# Patient Record
Sex: Female | Born: 1980
Health system: Southern US, Community
[De-identification: ages and names within clinical notes are randomized; demographics above are authoritative.]

## PROBLEM LIST (undated history)

## (undated) DIAGNOSIS — N289 Disorder of kidney and ureter, unspecified: Secondary | ICD-10-CM

## (undated) DIAGNOSIS — I10 Essential (primary) hypertension: Secondary | ICD-10-CM

---

## 2017-03-26 ENCOUNTER — Encounter (HOSPITAL_BASED_OUTPATIENT_CLINIC_OR_DEPARTMENT_OTHER): Payer: Self-pay | Admitting: Emergency Medicine

## 2017-03-26 ENCOUNTER — Emergency Department (HOSPITAL_BASED_OUTPATIENT_CLINIC_OR_DEPARTMENT_OTHER)
Admission: EM | Admit: 2017-03-26 | Discharge: 2017-03-26 | Disposition: A | Discharge status: Home / Self Care | Payer: Self-pay | Attending: Physician Assistant | Admitting: Physician Assistant

## 2017-03-26 ENCOUNTER — Emergency Department (HOSPITAL_BASED_OUTPATIENT_CLINIC_OR_DEPARTMENT_OTHER): Payer: Self-pay

## 2017-03-26 DIAGNOSIS — I10 Essential (primary) hypertension: Secondary | ICD-10-CM | POA: Insufficient documentation

## 2017-03-26 DIAGNOSIS — R51 Headache: Secondary | ICD-10-CM | POA: Insufficient documentation

## 2017-03-26 DIAGNOSIS — Z79899 Other long term (current) drug therapy: Secondary | ICD-10-CM | POA: Insufficient documentation

## 2017-03-26 HISTORY — DX: Essential (primary) hypertension: I10

## 2017-03-26 HISTORY — DX: Disorder of kidney and ureter, unspecified: N28.9

## 2017-03-26 LAB — URINALYSIS, ROUTINE W REFLEX MICROSCOPIC
Bilirubin Urine: NEGATIVE
Glucose, UA: NEGATIVE mg/dL
Ketones, ur: NEGATIVE mg/dL
LEUKOCYTES UA: NEGATIVE
Nitrite: NEGATIVE
PROTEIN: 100 mg/dL — AB
SPECIFIC GRAVITY, URINE: 1.007 (ref 1.005–1.030)
pH: 5 (ref 5.0–8.0)

## 2017-03-26 LAB — URINALYSIS, MICROSCOPIC (REFLEX)

## 2017-03-26 LAB — PREGNANCY, URINE: PREG TEST UR: NEGATIVE

## 2017-03-26 MED ORDER — AMLODIPINE BESYLATE 10 MG PO TABS: 10.0000 mg | ORAL_TABLET | Freq: Every day | ORAL | 1 refills | Status: DC

## 2017-03-26 MED ORDER — LOSARTAN POTASSIUM 50 MG PO TABS: 50.0000 mg | ORAL_TABLET | Freq: Every day | ORAL | 1 refills | Status: DC

## 2017-03-26 MED ORDER — IBUPROFEN 800 MG PO TABS
800.0000 mg | ORAL_TABLET | Freq: Once | ORAL | Status: AC
  Administered 2017-03-26: 800 mg via ORAL
  Filled 2017-03-26: qty 1

## 2017-03-26 MED ORDER — ACETAMINOPHEN 325 MG PO TABS
650.0000 mg | ORAL_TABLET | Freq: Once | ORAL | Status: AC
  Administered 2017-03-26: 650 mg via ORAL
  Filled 2017-03-26: qty 2

## 2017-03-26 MED ORDER — SUMATRIPTAN SUCCINATE 25 MG PO TABS
25.0000 mg | ORAL_TABLET | Freq: Once | ORAL | Status: DC
  Filled 2017-03-26: qty 1

## 2017-03-26 NOTE — Discharge Instructions (Signed)
To find a primary care or specialty doctor please call [phone] or [phone] to access "[facility] Find a Doctor Service." ° °You may also go on the [facility] website at [email or web address] ° °There are also multiple [facility] practices throughout the [place] that are frequently accepting new patients. You may find a clinic that is close to your home and contact them. ° °[facility] -  °[address] °[place] [address] °[phone] ° °[facility] in [place] (also locations in [place] and [place]) -  °[address] °[place] [address] [phone] ° °[facility] -  °[address] °[place] [address] [phone] ° ° °

## 2017-03-26 NOTE — ED Provider Notes (Addendum)
[facility] DEPT [facility] Provider Note   CSN: [id number] Arrival date & time: 03/26/17  1218     History   Chief Complaint Chief Complaint  Patient presents with  . Headache    HPI Samantha Barr is a 36 y.o. female.  HPI  Patient is a 36 year old female presenting with headaches. Patient has had headaches in the past.  She's not taking anything for her headhace today. She woke up with a headache this morning. She is concerned because she felt like her blood pressure was high. Patient is not having neurologic symptoms. Patient is accompanied by cousin his use as a Optometrist.  No nausea or vomiting or photophobia  Past Medical History:  Diagnosis Date  . Hypertension   . Renal disorder     There are no active problems to display for this patient.   History reviewed. No pertinent surgical history.  OB History    No data available       Home Medications    Prior to Admission medications   Medication Sig Start Date End Date Taking? Authorizing Provider  amLODipine (NORVASC) 10 MG tablet Take 10 mg by mouth daily.   Yes [provider]  losartan (COZAAR) 50 MG tablet Take 50 mg by mouth daily.   Yes [provider]    Family History No family history on file.  Social History Social History  Substance Use Topics  . Smoking status: Never Smoker  . Smokeless tobacco: Never Used  . Alcohol use No     Allergies   Patient has no known allergies.   Review of Systems Review of Systems  Constitutional: Negative for activity change.  Respiratory: Negative for shortness of breath.   Cardiovascular: Negative for chest pain.  Gastrointestinal: Negative for abdominal pain.  Neurological: Positive for headaches.     Physical Exam Updated Vital Signs BP (!) 124/95 (BP Location: Left Arm)   Pulse 96   Temp 99.2 F (37.3 C) (Oral)   Resp 16   Wt 70 kg (154 lb 5.2 oz)   LMP 02/11/2017 Comment: 4 months postpartum  SpO2 98%   Physical  Exam  Constitutional: She is oriented to person, place, and time. She appears well-developed and well-nourished.  HENT:  Head: Normocephalic and atraumatic.  Eyes: Conjunctivae and EOM are normal. Pupils are equal, round, and reactive to light. Right eye exhibits no discharge. Left eye exhibits no discharge.  Cardiovascular: Normal rate and regular rhythm.   No murmur heard. Pulmonary/Chest: Effort normal and breath sounds normal. No respiratory distress.  Neurological: She is oriented to person, place, and time.  Equal strength bilaterally upper and lower extremities negative pronator drift. Normal sensation bilaterally. Speech comprehensible, no slurring. Facial nerve tested and appears grossly normal. Alert and oriented 3.   Skin: Skin is warm and dry. She is not diaphoretic.  Psychiatric: She has a normal mood and affect.  Nursing note and vitals reviewed.    ED Treatments / Results  Labs (all labs ordered are listed, but only abnormal results are displayed) Labs Reviewed  URINALYSIS, ROUTINE W REFLEX MICROSCOPIC - Abnormal; Notable for the following:       Result Value   Hgb urine dipstick TRACE (*)    Protein, ur 100 (*)    All other components within normal limits  URINALYSIS, MICROSCOPIC (REFLEX) - Abnormal; Notable for the following:    Bacteria, UA RARE (*)    Squamous Epithelial / LPF 0-5 (*)    All other components within  normal limits  PREGNANCY, URINE    EKG  EKG Interpretation None       Radiology Ct Head Wo Contrast  Result Date: 03/26/2017 CLINICAL DATA:  36 year old female with a history of headache EXAM: CT HEAD WITHOUT CONTRAST TECHNIQUE: Contiguous axial images were obtained from the base of the skull through the vertex without intravenous contrast. COMPARISON:  None. FINDINGS: Brain: No acute intracranial hemorrhage. No midline shift or mass effect. Gray-white differentiation maintained. Unremarkable appearance of the ventricular system. Vascular:  Unremarkable. Skull: No acute fracture.  No aggressive bone lesion identified. Sinuses/Orbits: Unremarkable appearance of the orbits. Mastoid air cells clear. No middle ear effusion. No significant sinus disease. Other: None IMPRESSION: No CT evidence of acute intracranial abnormality Electronically Signed   By: Corrie Mckusick D.O.   On: 03/26/2017 13:31    Procedures Procedures (including critical care time)  Medications Ordered in ED Medications  SUMAtriptan (IMITREX) tablet 25 mg (not administered)  ibuprofen (ADVIL,MOTRIN) tablet 800 mg (not administered)     Initial Impression / Assessment and Plan / ED Course  I have reviewed the triage vital signs and the nursing notes.  Pertinent labs & imaging results that were available during my care of the patient were reviewed by me and considered in my medical decision making (see chart for details).     Patient is a well-appearing 36 year old female presenting with headache. Is difficult to tellwhat made her come today. Patient's cousin was worried because of a elevated blood pressure taken at home. Patient's blood pressure here is reassuring. She has a normal neurologic exam. We'll get a CT head to make sure that we are not missing an intracranial issue. Otherwise we'll we gave her the option of treating orally or the IV. She is fine with oral treatment at this time.  2:10 PM Figured out why patient came in today. She is actually looking for refills on her prescriptions for high blood pressure that she was on prior to moving to the [place] We will refill both of those Prescriptions. We'll also give her our [facility] phone number to establish care with someone who can treat her without insurance.  Patient breasting a 4 mo.   Final Clinical Impressions(s) / ED Diagnoses   Final diagnoses:  None    New Prescriptions New Prescriptions   No medications on file     Macarthur Critchley, MD 03/26/17 1410    Macarthur Critchley, MD 03/26/17 1411

## 2017-03-26 NOTE — ED Triage Notes (Signed)
H/A today and nausea.

## 2017-09-19 ENCOUNTER — Emergency Department (HOSPITAL_COMMUNITY)
Admission: EM | Admit: 2017-09-19 | Discharge: 2017-09-19 | Disposition: A | Discharge status: Home / Self Care | Payer: Self-pay | Attending: Emergency Medicine | Admitting: Emergency Medicine

## 2017-09-19 ENCOUNTER — Encounter (HOSPITAL_COMMUNITY): Payer: Self-pay | Admitting: *Deleted

## 2017-09-19 ENCOUNTER — Other Ambulatory Visit: Payer: Self-pay

## 2017-09-19 DIAGNOSIS — I10 Essential (primary) hypertension: Secondary | ICD-10-CM

## 2017-09-19 DIAGNOSIS — R7989 Other specified abnormal findings of blood chemistry: Secondary | ICD-10-CM | POA: Insufficient documentation

## 2017-09-19 DIAGNOSIS — R590 Localized enlarged lymph nodes: Secondary | ICD-10-CM | POA: Insufficient documentation

## 2017-09-19 DIAGNOSIS — Z79899 Other long term (current) drug therapy: Secondary | ICD-10-CM | POA: Insufficient documentation

## 2017-09-19 DIAGNOSIS — K0889 Other specified disorders of teeth and supporting structures: Secondary | ICD-10-CM | POA: Insufficient documentation

## 2017-09-19 DIAGNOSIS — I129 Hypertensive chronic kidney disease with stage 1 through stage 4 chronic kidney disease, or unspecified chronic kidney disease: Secondary | ICD-10-CM | POA: Insufficient documentation

## 2017-09-19 DIAGNOSIS — R109 Unspecified abdominal pain: Secondary | ICD-10-CM | POA: Insufficient documentation

## 2017-09-19 DIAGNOSIS — N189 Chronic kidney disease, unspecified: Secondary | ICD-10-CM | POA: Insufficient documentation

## 2017-09-19 LAB — BASIC METABOLIC PANEL
Anion gap: 5 (ref 5–15)
BUN: 20 mg/dL (ref 6–20)
CALCIUM: 9.2 mg/dL (ref 8.9–10.3)
CHLORIDE: 108 mmol/L (ref 101–111)
CO2: 26 mmol/L (ref 22–32)
CREATININE: 1.6 mg/dL — AB (ref 0.44–1.00)
GFR, EST AFRICAN AMERICAN: 46 mL/min — AB (ref >60)
GFR, EST NON AFRICAN AMERICAN: 40 mL/min — AB (ref >60)
Glucose, Bld: 109 mg/dL — ABNORMAL HIGH (ref 65–99)
Potassium: 4.9 mmol/L (ref 3.5–5.1)
SODIUM: 139 mmol/L (ref 135–145)

## 2017-09-19 LAB — URINALYSIS, ROUTINE W REFLEX MICROSCOPIC
BILIRUBIN URINE: NEGATIVE
Glucose, UA: NEGATIVE mg/dL
Ketones, ur: NEGATIVE mg/dL
LEUKOCYTES UA: NEGATIVE
Nitrite: NEGATIVE
Protein, ur: 100 mg/dL — AB
SPECIFIC GRAVITY, URINE: 1.006 (ref 1.005–1.030)
Squamous Epithelial / LPF: NONE SEEN
pH: 5 (ref 5.0–8.0)

## 2017-09-19 LAB — PREGNANCY, URINE: Preg Test, Ur: NEGATIVE

## 2017-09-19 MED ORDER — CLINDAMYCIN HCL 300 MG PO CAPS: 300.0000 mg | ORAL_CAPSULE | Freq: Three times a day (TID) | ORAL | 0 refills | Status: DC

## 2017-09-19 NOTE — ED Triage Notes (Signed)
Pt has a history of htn and takes losarten and amlodipine. The hbp has affected. Pt states left flank area has been hurting for 2 weeks and complains of knots under her ears.

## 2017-09-19 NOTE — ED Provider Notes (Signed)
[facility] EMERGENCY DEPARTMENT Provider Note   CSN: [id number] Arrival date & time: 09/19/17  1107     History   Chief Complaint No chief complaint on file.   HPI Samantha Barr is a 36 y.o. female who presents with L flank pain and "knots" around the ears. PMH significant for HTN and CKD. The patient's husband is at bedside and helps provide history. History is limited due to language barrier. She states that she had hypertension and takes Amlodipine and Losartan. She was in [place] several months ago and went for a blood pressure check and the doctor told her she had a problem with her kidneys. She has been back in the [place] for over a month now and reports that she has developed left sided flank pain for the past week. It is intermittent. Nothing makes it better or worse. She has not tried anything for pain. She denies fever, chills, chest pain, SOB, abdominal pain, N/V/D, urinary symptoms. She has not had a period in several years since she had her last baby.  Additionally she is complaining of "knots" behind her ears. These have been present for about 1 month. They are on both sides of her head and mildly tender. She denies headache, ear pain, runny nose, sore throat. She does have left lower dental pain. She last saw a dentist a couple weeks ago who performed a cleaning.   She does not currently have a PCP.  HPI  Past Medical History:  Diagnosis Date  . Hypertension   . Renal disorder     There are no active problems to display for this patient.   History reviewed. No pertinent surgical history.  OB History    No data available       Home Medications    Prior to Admission medications   Medication Sig Start Date End Date Taking? Authorizing Provider  amLODipine (NORVASC) 10 MG tablet Take 10 mg by mouth daily.    [provider]  amLODipine (NORVASC) 10 MG tablet Take 1 tablet (10 mg total) by mouth daily. 03/26/17   Mackuen, Courteney Lyn,  MD  losartan (COZAAR) 50 MG tablet Take 50 mg by mouth daily.    [provider]  losartan (COZAAR) 50 MG tablet Take 1 tablet (50 mg total) by mouth daily. 03/26/17   Mackuen, Fredia Sorrow, MD    Family History No family history on file.  Social History Social History   Tobacco Use  . Smoking status: Never Smoker  . Smokeless tobacco: Never Used  Substance Use Topics  . Alcohol use: No  . Drug use: No     Allergies   Patient has no known allergies.   Review of Systems Review of Systems  Constitutional: Negative for chills and fever.  Respiratory: Negative for shortness of breath.   Cardiovascular: Negative for chest pain.  Gastrointestinal: Negative for abdominal pain, diarrhea, nausea and vomiting.  Genitourinary: Positive for flank pain. Negative for difficulty urinating, dysuria, hematuria, menstrual problem, vaginal bleeding and vaginal discharge.  Allergic/Immunologic: Negative for immunocompromised state.  Hematological: Positive for adenopathy.  All other systems reviewed and are negative.    Physical Exam Updated Vital Signs BP 119/86   Pulse 90   Temp 98.5 F (36.9 C) (Oral)   Resp 16   SpO2 100%   Physical Exam  Constitutional: She is oriented to person, place, and time. She appears well-developed and well-nourished. No distress.  HENT:  Head: Normocephalic and atraumatic.  Right Ear:  Hearing, tympanic membrane, external ear and ear canal normal.  Left Ear: Hearing, tympanic membrane, external ear and ear canal normal.  Nose: Nose normal.  Mouth/Throat: Uvula is midline, oropharynx is clear and moist and mucous membranes are normal. No dental abscesses.  Posterior occipital bilateral lymphadenopathy  Eyes: Conjunctivae are normal. Pupils are equal, round, and reactive to light. Right eye exhibits no discharge. Left eye exhibits no discharge. No scleral icterus.  Neck: Normal range of motion.  Cardiovascular: Normal rate and regular rhythm.  Exam reveals no gallop and no friction rub.  No murmur heard. Pulmonary/Chest: Effort normal and breath sounds normal. No stridor. No respiratory distress. She has no wheezes. She has no rales. She exhibits no tenderness.  Abdominal: Soft. Bowel sounds are normal. She exhibits no distension and no mass. There is no tenderness. There is no rebound and no guarding. No hernia.  No CVA tenderness  Neurological: She is alert and oriented to person, place, and time.  Skin: Skin is warm and dry.  Psychiatric: She has a normal mood and affect. Her behavior is normal.  Nursing note and vitals reviewed.    ED Treatments / Results  Labs (all labs ordered are listed, but only abnormal results are displayed) Labs Reviewed  URINALYSIS, ROUTINE W REFLEX MICROSCOPIC - Abnormal; Notable for the following components:      Result Value   Color, Urine STRAW (*)    Hgb urine dipstick SMALL (*)    Protein, ur 100 (*)    Bacteria, UA RARE (*)    All other components within normal limits  BASIC METABOLIC PANEL - Abnormal; Notable for the following components:   Glucose, Bld 109 (*)    Creatinine, Ser 1.60 (*)    GFR calc non Af Amer 40 (*)    GFR calc Af Amer 46 (*)    All other components within normal limits  PREGNANCY, URINE  POC URINE PREG, ED    EKG  EKG Interpretation None       Radiology No results found.  Procedures Procedures (including critical care time)  Medications Ordered in ED Medications - No data to display   Initial Impression / Assessment and Plan / ED Course  I have reviewed the triage vital signs and the nursing notes.  Pertinent labs & imaging results that were available during my care of the patient were reviewed by me and considered in my medical decision making (see chart for details).  36 year old female with L flank pain and lymphadenopathy. Vitals are normal. BMP shows SCr is 1.6 with no prior value to compare. UA shows small hgb, 100 protein, rare bacteria.  She likely has CKD. Blood pressure in the ED is controlled. Unclear etiology of flank pain, may be muscular but she has no tenderness on exam. I do not see the need for any emergent testing at this time. I advised her to follow up with [facility].   She does have bilateral posterior occipital lymphadenopathy. Unclear etiology of this as well. She denies fevers and HEENT exam is otherwise unremarkable. She is complaining of some dental pain. Will give rx for Clindamycin and again advised her to follow up with PCP to ensure this is resolving.    Final Clinical Impressions(s) / ED Diagnoses   Final diagnoses:  Hypertension, unspecified type  Elevated serum creatinine  Lymphadenopathy of head and neck region    ED Discharge Orders    None       Gekas,  Jesse Fall, PA-C 09/19/17 2011    Fatima Blank, MD 09/20/17 [phone]

## 2017-09-19 NOTE — Discharge Instructions (Signed)
Please continue blood pressure medicines Take Tylenol for pain. Avoid NSAIDS (Ibuprofen, Naproxen) Take Clindamycin three times daily for 10 days Follow up with [facility] for blood pressure recheck and to check lymph node

## 2017-10-13 ENCOUNTER — Ambulatory Visit: Payer: Self-pay | Attending: Nurse Practitioner | Admitting: Nurse Practitioner

## 2017-10-13 ENCOUNTER — Encounter: Payer: Self-pay | Admitting: Nurse Practitioner

## 2017-10-13 ENCOUNTER — Inpatient Hospital Stay: Payer: Self-pay | Admitting: Family Medicine

## 2017-10-13 VITALS — BP 108/75 | HR 87 | Temp 98.4°F | Ht 65.35 in | Wt 152.4 lb

## 2017-10-13 DIAGNOSIS — Z79899 Other long term (current) drug therapy: Secondary | ICD-10-CM | POA: Insufficient documentation

## 2017-10-13 DIAGNOSIS — N183 Chronic kidney disease, stage 3 unspecified: Secondary | ICD-10-CM

## 2017-10-13 DIAGNOSIS — I10 Essential (primary) hypertension: Secondary | ICD-10-CM

## 2017-10-13 DIAGNOSIS — I129 Hypertensive chronic kidney disease with stage 1 through stage 4 chronic kidney disease, or unspecified chronic kidney disease: Secondary | ICD-10-CM | POA: Insufficient documentation

## 2017-10-13 LAB — POCT GLYCOSYLATED HEMOGLOBIN (HGB A1C): HEMOGLOBIN A1C: 5.1

## 2017-10-13 MED ORDER — LOSARTAN POTASSIUM 50 MG PO TABS: 50.0000 mg | ORAL_TABLET | Freq: Every day | ORAL | 1 refills | Status: DC

## 2017-10-13 MED ORDER — AMLODIPINE BESYLATE 10 MG PO TABS: 10.0000 mg | ORAL_TABLET | Freq: Every day | ORAL | 1 refills | Status: DC

## 2017-10-13 NOTE — Progress Notes (Signed)
Assessment & Plan:  Samantha Barr was seen today for hospitalization follow-up.  Diagnoses and all orders for this visit:  Essential hypertension -     HgB A1c -     CBC -     Lipid panel Continue all antihypertensives as prescribed.  Remember to bring in your blood pressure log with you for your follow up appointment.  DASH/Mediterranean Diets are healthier choices for HTN.       amLODipine (NORVASC) 10 MG tablet; Take 1 tablet (10 mg total) by mouth daily. -     losartan (COZAAR) 50 MG tablet; Take 1 tablet (50 mg total) by mouth daily.  Stage 3 chronic kidney disease ([place]) -     Ambulatory referral to Nephrology Do not use more than 1.8gms of Na in your diet daily. Drink at least 48 oz of water per day.   Patient has been counseled on age-appropriate routine health concerns for screening and prevention. These are reviewed and up-to-date. Referrals have been placed accordingly. Immunizations are up-to-date or declined.    Subjective:   Chief Complaint  Patient presents with  . Hospitalization Follow-up    Patient is here for a hospital follow-up for hypertension.    HPI Samantha Barr 36 y.o. female presents to office today to establish care as a new patient. Her husband is here interpreting for her today.   Essential Hypertension She reports having a history of hypertension. She was diagnosed over 5 years ago. She has been taking losartan 50mg  and norvasc 10mg  daily as prescribed. She does report missing doses "sometimes". She has paperwork from [place] dated 11-23-2016 that notes CKD with 8cm kidney and left kidney 7.8cm with renal parenchymal thickness of 20mm on left and 13mm on right. She has not seen a nephrologist. She denies any GU symptoms at this time. Blood pressure is well controlled. She denies chest pain, shortness of breath, palpitations, lightheadedness, dizziness, hematuria, flank pain, bilateral lower extremity edema, anuria, oliguria or polyuria.  BP Readings from  Last 3 Encounters:  10/13/17 108/75  09/19/17 114/90  03/26/17 (!) 124/95    Lab Results  Component Value Date   CREATININE 1.60 (H) 09/19/2017     Review of Systems  Constitutional: Negative for fever, malaise/fatigue and weight loss.  HENT: Negative.  Negative for nosebleeds.   Eyes: Negative.  Negative for blurred vision, double vision and photophobia.  Respiratory: Negative.  Negative for cough and shortness of breath.   Cardiovascular: Negative.  Negative for chest pain, palpitations and leg swelling.  Gastrointestinal: Negative.  Negative for abdominal pain, constipation, diarrhea, heartburn, nausea and vomiting.  Musculoskeletal: Negative.  Negative for myalgias.  Neurological: Negative.  Negative for dizziness, focal weakness, seizures and headaches.  Endo/Heme/Allergies: Negative for environmental allergies.  Psychiatric/Behavioral: Negative.  Negative for suicidal ideas.    Past Medical History:  Diagnosis Date  . Hypertension   . Renal disorder     History reviewed. No pertinent surgical history.  Family History  Problem Relation Age of Onset  . Hypertension Maternal Aunt     Social History Reviewed with no changes to be made today.   Outpatient Medications Prior to Visit  Medication Sig Dispense Refill  . amLODipine (NORVASC) 10 MG tablet Take 10 mg by mouth daily.    Marland Kitchen amLODipine (NORVASC) 10 MG tablet Take 1 tablet (10 mg total) by mouth daily. 30 tablet 1  . losartan (COZAAR) 50 MG tablet Take 50 mg by mouth daily.    Marland Kitchen losartan (COZAAR) 50  MG tablet Take 1 tablet (50 mg total) by mouth daily. 30 tablet 1  . clindamycin (CLEOCIN) 300 MG capsule Take 1 capsule (300 mg total) by mouth 3 (three) times daily. (Patient not taking: Reported on 10/13/2017) 30 capsule 0   No facility-administered medications prior to visit.     No Known Allergies     Objective:    BP 108/75 (BP Location: Left Arm, Patient Position: Sitting, Cuff Size: Normal)   Pulse 87    Temp 98.4 F (36.9 C) (Oral)   Ht 5' 5.35" (1.66 m)   Wt 152 lb 6.4 oz (69.1 kg)   SpO2 97%   BMI 25.09 kg/m  Wt Readings from Last 3 Encounters:  10/13/17 152 lb 6.4 oz (69.1 kg)  03/26/17 154 lb 5.2 oz (70 kg)    Physical Exam  Constitutional: She is oriented to person, place, and time. She appears well-developed and well-nourished. She is cooperative.  HENT:  Head: Normocephalic and atraumatic.  Eyes: EOM are normal.  Neck: Normal range of motion.  Cardiovascular: Normal rate, regular rhythm, normal heart sounds and intact distal pulses. Exam reveals no gallop and no friction rub.  No murmur heard. Pulmonary/Chest: Effort normal and breath sounds normal. No tachypnea. No respiratory distress. She has no decreased breath sounds. She has no wheezes. She has no rhonchi. She has no rales. She exhibits no tenderness.  Abdominal: Soft. Bowel sounds are normal.  Musculoskeletal: Normal range of motion. She exhibits no edema.  Neurological: She is alert and oriented to person, place, and time. Coordination normal.  Skin: Skin is warm and dry.  Psychiatric: She has a normal mood and affect. Her behavior is normal. Judgment and thought content normal.  Nursing note and vitals reviewed.      Patient has been counseled extensively about nutrition and exercise as well as the importance of adherence with medications and regular follow-up. The patient was given clear instructions to go to ER or return to medical center if symptoms don't improve, worsen or new problems develop. The patient verbalized understanding.   Follow-up: Return in about 2 months (around 12/14/2017) for NEEDS TO SCHEDULE PAP AND PHYSICAL: Needs appointment with financial representative.Gildardo Pounds, FNP-BC [facility] [place], [place]   10/18/2017, 10:54 PM

## 2017-10-13 NOTE — Patient Instructions (Addendum)
You can use Mrs. Dash instead of actual salt to season instead of salt.     Chronic Kidney Disease, Adult Chronic kidney disease (CKD) happens when the kidneys are damaged during a time of 3 or more months. The kidneys are two organs that do many important jobs in the body. These jobs include:  Removing wastes and extra fluids from the blood.  Making hormones that maintain the amount of fluid in your tissues and blood vessels.  Making sure that the body has the right amount of fluids and chemicals.  Most of the time, this condition does not go away, but it can usually be controlled. Steps must be taken to slow down the kidney damage or stop it from getting worse. Otherwise, the kidneys may stop working. Follow these instructions at home:  Follow your diet as told by your doctor. You may need to avoid alcohol, salty foods (sodium), and foods that are high in potassium, calcium, and protein.  Take over-the-counter and prescription medicines only as told by your doctor. Do not take any new medicines unless your doctor says you can do that. These include vitamins and minerals. ? Medicines and nutritional supplements can make kidney damage worse. ? Your doctor may need to change how much medicine you take.  Do not use any tobacco products. These include cigarettes, chewing tobacco, and e-cigarettes. If you need help quitting, ask your doctor.  Keep all follow-up visits as told by your doctor. This is important.  Check your blood pressure. Tell your doctor if there are changes to your blood pressure.  Get to a healthy weight. Stay at that weight. If you need help with this, ask your doctor.  Start or continue an exercise plan. Try to exercise at least 30 minutes a day, 5 days a week.  Stay up-to-date with your shots (immunizations) as told by your doctor. Contact a doctor if:  Your symptoms get worse.  You have new symptoms. Get help right away if:  You have symptoms of end-stage  kidney disease. These include: ? Headaches. ? Skin that is darker or lighter than normal. ? Numbness in your hands or feet. ? Easy bruising. ? Having hiccups often. ? Chest pain. ? Shortness of breath. ? Stopping of menstrual periods in women.  You have a fever.  You are making very little pee (urine).  You have pain or bleeding when you pee (urinate). This information is not intended to replace advice given to you by your health care provider. Make sure you discuss any questions you have with your health care provider. Document Released: 01/11/2010 Document Revised: 03/24/2016 Document Reviewed: 06/15/2012 Elsevier Interactive Patient Education  2017 [employer]  Chronic Kidney Disease, Adult Chronic kidney disease (CKD) happens when the kidneys are damaged during a time of 3 or more months. The kidneys are two organs that do many important jobs in the body. These jobs include:  Removing wastes and extra fluids from the blood.  Making hormones that maintain the amount of fluid in your tissues and blood vessels.  Making sure that the body has the right amount of fluids and chemicals.  Most of the time, this condition does not go away, but it can usually be controlled. Steps must be taken to slow down the kidney damage or stop it from getting worse. Otherwise, the kidneys may stop working. Follow these instructions at home:  Follow your diet as told by your doctor. You may need to avoid alcohol, salty foods (sodium), and foods  that are high in potassium, calcium, and protein.  Take over-the-counter and prescription medicines only as told by your doctor. Do not take any new medicines unless your doctor says you can do that. These include vitamins and minerals. ? Medicines and nutritional supplements can make kidney damage worse. ? Your doctor may need to change how much medicine you take.  Do not use any tobacco products. These include cigarettes, chewing tobacco, and  e-cigarettes. If you need help quitting, ask your doctor.  Keep all follow-up visits as told by your doctor. This is important.  Check your blood pressure. Tell your doctor if there are changes to your blood pressure.  Get to a healthy weight. Stay at that weight. If you need help with this, ask your doctor.  Start or continue an exercise plan. Try to exercise at least 30 minutes a day, 5 days a week.  Stay up-to-date with your shots (immunizations) as told by your doctor. Contact a doctor if:  Your symptoms get worse.  You have new symptoms. Get help right away if:  You have symptoms of end-stage kidney disease. These include: ? Headaches. ? Skin that is darker or lighter than normal. ? Numbness in your hands or feet. ? Easy bruising. ? Having hiccups often. ? Chest pain. ? Shortness of breath. ? Stopping of menstrual periods in women.  You have a fever.  You are making very little pee (urine).  You have pain or bleeding when you pee (urinate). This information is not intended to replace advice given to you by your health care provider. Make sure you discuss any questions you have with your health care provider. Document Released: 01/11/2010 Document Revised: 03/24/2016 Document Reviewed: 06/15/2012 Elsevier Interactive Patient Education  2017 [employer].  Chronic Kidney Disease, Adult Chronic kidney disease (CKD) happens when the kidneys are damaged during a time of 3 or more months. The kidneys are two organs that do many important jobs in the body. These jobs include:  Removing wastes and extra fluids from the blood.  Making hormones that maintain the amount of fluid in your tissues and blood vessels.  Making sure that the body has the right amount of fluids and chemicals.  Most of the time, this condition does not go away, but it can usually be controlled. Steps must be taken to slow down the kidney damage or stop it from getting worse. Otherwise, the kidneys may  stop working. Follow these instructions at home:  Follow your diet as told by your doctor. You may need to avoid alcohol, salty foods (sodium), and foods that are high in potassium, calcium, and protein.  Take over-the-counter and prescription medicines only as told by your doctor. Do not take any new medicines unless your doctor says you can do that. These include vitamins and minerals. ? Medicines and nutritional supplements can make kidney damage worse. ? Your doctor may need to change how much medicine you take.  Do not use any tobacco products. These include cigarettes, chewing tobacco, and e-cigarettes. If you need help quitting, ask your doctor.  Keep all follow-up visits as told by your doctor. This is important.  Check your blood pressure. Tell your doctor if there are changes to your blood pressure.  Get to a healthy weight. Stay at that weight. If you need help with this, ask your doctor.  Start or continue an exercise plan. Try to exercise at least 30 minutes a day, 5 days a week.  Stay up-to-date with your shots (  immunizations) as told by your doctor. Contact a doctor if:  Your symptoms get worse.  You have new symptoms. Get help right away if:  You have symptoms of end-stage kidney disease. These include: ? Headaches. ? Skin that is darker or lighter than normal. ? Numbness in your hands or feet. ? Easy bruising. ? Having hiccups often. ? Chest pain. ? Shortness of breath. ? Stopping of menstrual periods in women.  You have a fever.  You are making very little pee (urine).  You have pain or bleeding when you pee (urinate). This information is not intended to replace advice given to you by your health care provider. Make sure you discuss any questions you have with your health care provider. Document Released: 01/11/2010 Document Revised: 03/24/2016 Document Reviewed: 06/15/2012 Elsevier Interactive Patient Education  2017 [place] DASH stands for "Dietary Approaches to Stop Hypertension." The DASH eating plan is a healthy eating plan that has been shown to reduce high blood pressure (hypertension). It may also reduce your risk for type 2 diabetes, heart disease, and stroke. The DASH eating plan may also help with weight loss. What are tips for following this plan? General guidelines  Avoid eating more than 2,300 mg (milligrams) of salt (sodium) a day. If you have hypertension, you may need to reduce your sodium intake to 1,500 mg a day.  Limit alcohol intake to no more than 1 drink a day for nonpregnant women and 2 drinks a day for men. One drink equals 12 oz of beer, 5 oz of wine, or 1 oz of hard liquor.  Work with your health care provider to maintain a healthy body weight or to lose weight. Ask what an ideal weight is for you.  Get at least 30 minutes of exercise that causes your heart to beat faster (aerobic exercise) most days of the week. Activities may include walking, swimming, or biking.  Work with your health care provider or diet and nutrition specialist (dietitian) to adjust your eating plan to your individual calorie needs. Reading food labels  Check food labels for the amount of sodium per serving. Choose foods with less than 5 percent of the Daily Value of sodium. Generally, foods with less than 300 mg of sodium per serving fit into this eating plan.  To find whole grains, look for the word "whole" as the first word in the ingredient list. Shopping  Buy products labeled as "low-sodium" or "no salt added."  Buy fresh foods. Avoid canned foods and premade or frozen meals. Cooking  Avoid adding salt when cooking. Use salt-free seasonings or herbs instead of table salt or sea salt. Check with your health care provider or pharmacist before using salt substitutes.  Do not fry foods. Cook foods using healthy methods such as baking, boiling, grilling, and broiling instead.  Cook with heart-healthy  oils, such as olive, canola, soybean, or sunflower oil. Meal planning   Eat a balanced diet that includes: ? 5 or more servings of fruits and vegetables each day. At each meal, try to fill half of your plate with fruits and vegetables. ? Up to 6-8 servings of whole grains each day. ? Less than 6 oz of lean meat, poultry, or fish each day. A 3-oz serving of meat is about the same size as a deck of cards. One egg equals 1 oz. ? 2 servings of low-fat dairy each day. ? A serving of nuts, seeds, or beans 5 times each week. ?  Heart-healthy fats. Healthy fats called Omega-3 fatty acids are found in foods such as flaxseeds and coldwater fish, like sardines, salmon, and mackerel.  Limit how much you eat of the following: ? Canned or prepackaged foods. ? Food that is high in trans fat, such as fried foods. ? Food that is high in saturated fat, such as fatty meat. ? Sweets, desserts, sugary drinks, and other foods with added sugar. ? Full-fat dairy products.  Do not salt foods before eating.  Try to eat at least 2 vegetarian meals each week.  Eat more home-cooked food and less restaurant, buffet, and fast food.  When eating at a restaurant, ask that your food be prepared with less salt or no salt, if possible. What foods are recommended? The items listed may not be a complete list. Talk with your dietitian about what dietary choices are best for you. Grains Whole-grain or whole-wheat bread. Whole-grain or whole-wheat pasta. Brown rice. [place]. Bulgur. Whole-grain and low-sodium cereals. Pita bread. Low-fat, low-sodium crackers. Whole-wheat flour tortillas. Vegetables Fresh or frozen vegetables (raw, steamed, roasted, or grilled). Low-sodium or reduced-sodium tomato and vegetable juice. Low-sodium or reduced-sodium tomato sauce and tomato paste. Low-sodium or reduced-sodium canned vegetables. Fruits All fresh, dried, or frozen fruit. Canned fruit in natural juice (without added  sugar). Meat and other protein foods Skinless chicken or [place]. Ground chicken or [place]. Pork with fat trimmed off. Fish and seafood. Egg whites. Dried beans, peas, or lentils. Unsalted nuts, nut butters, and seeds. Unsalted canned beans. Lean cuts of beef with fat trimmed off. Low-sodium, lean deli meat. Dairy Low-fat (1%) or fat-free (skim) milk. Fat-free, low-fat, or reduced-fat cheeses. Nonfat, low-sodium ricotta or cottage cheese. Low-fat or nonfat yogurt. Low-fat, low-sodium cheese. Fats and oils Soft margarine without trans fats. Vegetable oil. Low-fat, reduced-fat, or light mayonnaise and salad dressings (reduced-sodium). Canola, safflower, olive, soybean, and sunflower oils. Avocado. Seasoning and other foods Herbs. Spices. Seasoning mixes without salt. Unsalted popcorn and pretzels. Fat-free sweets. What foods are not recommended? The items listed may not be a complete list. Talk with your dietitian about what dietary choices are best for you. Grains Baked goods made with fat, such as croissants, muffins, or some breads. Dry pasta or rice meal packs. Vegetables Creamed or fried vegetables. Vegetables in a cheese sauce. Regular canned vegetables (not low-sodium or reduced-sodium). Regular canned tomato sauce and paste (not low-sodium or reduced-sodium). Regular tomato and vegetable juice (not low-sodium or reduced-sodium). Angie Fava. Olives. Fruits Canned fruit in a light or heavy syrup. Fried fruit. Fruit in cream or butter sauce. Meat and other protein foods Fatty cuts of meat. Ribs. Fried meat. Berniece Salines. Sausage. Bologna and other processed lunch meats. Salami. Fatback. Hotdogs. Bratwurst. Salted nuts and seeds. Canned beans with added salt. Canned or smoked fish. Whole eggs or egg yolks. Chicken or [place] with skin. Dairy Whole or 2% milk, cream, and half-and-half. Whole or full-fat cream cheese. Whole-fat or sweetened yogurt. Full-fat cheese. Nondairy creamers. Whipped toppings.  Processed cheese and cheese spreads. Fats and oils Butter. Stick margarine. Lard. Shortening. Ghee. Bacon fat. Tropical oils, such as coconut, palm kernel, or palm oil. Seasoning and other foods Salted popcorn and pretzels. Onion salt, garlic salt, seasoned salt, table salt, and sea salt. Worcestershire sauce. Tartar sauce. Barbecue sauce. Teriyaki sauce. Soy sauce, including reduced-sodium. Steak sauce. Canned and packaged gravies. Fish sauce. Oyster sauce. Cocktail sauce. Horseradish that you find on the shelf. Ketchup. Mustard. Meat flavorings and tenderizers. Bouillon cubes. Hot sauce and Tabasco sauce.  Premade or packaged marinades. Premade or packaged taco seasonings. Relishes. Regular salad dressings. Where to find more information:  National Heart, Lung, and [place]: https://wilson-eaton.com/  American Heart Association: www.heart.org Summary  The DASH eating plan is a healthy eating plan that has been shown to reduce high blood pressure (hypertension). It may also reduce your risk for type 2 diabetes, heart disease, and stroke.  With the DASH eating plan, you should limit salt (sodium) intake to 2,300 mg a day. If you have hypertension, you may need to reduce your sodium intake to 1,500 mg a day.  When on the DASH eating plan, aim to eat more fresh fruits and vegetables, whole grains, lean proteins, low-fat dairy, and heart-healthy fats.  Work with your health care provider or diet and nutrition specialist (dietitian) to adjust your eating plan to your individual calorie needs. This information is not intended to replace advice given to you by your health care provider. Make sure you discuss any questions you have with your health care provider. Document Released: 10/06/2011 Document Revised: 10/10/2016 Document Reviewed: 10/10/2016 Elsevier Interactive Patient Education  2017 [employer].

## 2017-10-14 LAB — LIPID PANEL
CHOL/HDL RATIO: 7.7 ratio — AB (ref 0.0–4.4)
Cholesterol, Total: 246 mg/dL — ABNORMAL HIGH (ref 100–199)
HDL: 32 mg/dL — AB (ref >39)
TRIGLYCERIDES: 438 mg/dL — AB (ref 0–149)

## 2017-10-14 LAB — CBC
HEMATOCRIT: 41.1 % (ref 34.0–46.6)
Hemoglobin: 13.7 g/dL (ref 11.1–15.9)
MCH: 28.7 pg (ref 26.6–33.0)
MCHC: 33.3 g/dL (ref 31.5–35.7)
MCV: 86 fL (ref 79–97)
Platelets: 221 10*3/uL (ref 150–379)
RBC: 4.77 x10E6/uL (ref 3.77–5.28)
RDW: 15.1 % (ref 12.3–15.4)
WBC: 5.5 10*3/uL (ref 3.4–10.8)

## 2017-10-18 ENCOUNTER — Encounter: Payer: Self-pay | Admitting: Nurse Practitioner

## 2017-10-25 ENCOUNTER — Ambulatory Visit: Payer: Self-pay | Attending: Nurse Practitioner

## 2017-11-01 ENCOUNTER — Telehealth: Payer: Self-pay

## 2017-11-01 NOTE — Telephone Encounter (Signed)
-----   Message from Gildardo Pounds, NP sent at 10/31/2017 11:15 PM EST ----- Your lipid panel is very abnormal. Please work on eating a low fat, heart healthy diet and participate in regular aerobic exercise program to control as well. Work out at least 30 minutes per day. If your levels continue to stay elevated we may have to start a prescription lowering medication. Avoid take out foods, fried foods, unhealthy snacks, red meat and junk food.

## 2017-11-01 NOTE — Telephone Encounter (Signed)
Patient's husband informed on patient's lab result. Patient's husband will relay message to patient.

## 2017-12-15 ENCOUNTER — Ambulatory Visit: Payer: BLUE CROSS/BLUE SHIELD | Attending: Nurse Practitioner | Admitting: Nurse Practitioner

## 2017-12-15 ENCOUNTER — Encounter: Payer: Self-pay | Admitting: Nurse Practitioner

## 2017-12-15 VITALS — BP 127/86 | HR 106 | Temp 98.2°F | Ht 65.0 in | Wt 156.2 lb

## 2017-12-15 DIAGNOSIS — Z760 Encounter for issue of repeat prescription: Secondary | ICD-10-CM | POA: Diagnosis not present

## 2017-12-15 DIAGNOSIS — Z79899 Other long term (current) drug therapy: Secondary | ICD-10-CM | POA: Diagnosis not present

## 2017-12-15 DIAGNOSIS — Z0000 Encounter for general adult medical examination without abnormal findings: Secondary | ICD-10-CM

## 2017-12-15 DIAGNOSIS — N183 Chronic kidney disease, stage 3 unspecified: Secondary | ICD-10-CM

## 2017-12-15 DIAGNOSIS — Z0001 Encounter for general adult medical examination with abnormal findings: Secondary | ICD-10-CM | POA: Insufficient documentation

## 2017-12-15 DIAGNOSIS — M549 Dorsalgia, unspecified: Secondary | ICD-10-CM | POA: Insufficient documentation

## 2017-12-15 DIAGNOSIS — I129 Hypertensive chronic kidney disease with stage 1 through stage 4 chronic kidney disease, or unspecified chronic kidney disease: Secondary | ICD-10-CM | POA: Insufficient documentation

## 2017-12-15 DIAGNOSIS — Z8249 Family history of ischemic heart disease and other diseases of the circulatory system: Secondary | ICD-10-CM | POA: Insufficient documentation

## 2017-12-15 DIAGNOSIS — I10 Essential (primary) hypertension: Secondary | ICD-10-CM

## 2017-12-15 MED ORDER — AMLODIPINE BESYLATE 10 MG PO TABS: 10.0000 mg | ORAL_TABLET | Freq: Every day | ORAL | 1 refills | Status: DC

## 2017-12-15 MED ORDER — LOSARTAN POTASSIUM 50 MG PO TABS: 50.0000 mg | ORAL_TABLET | Freq: Every day | ORAL | 1 refills | Status: DC

## 2017-12-15 NOTE — Progress Notes (Signed)
Assessment & Plan:  Samantha Barr was seen today for annual exam, back pain and medication refill.  Diagnoses and all orders for this visit:  Well woman exam (no gynecological exam) Scheduled PAP 2 weeks  Stage 3 chronic kidney disease ([place]) -     Ambulatory referral to Nephrology She has paperwork from Samantha Barr dated 11-23-2016 that notes CKD with 8cm kidney and left kidney 7.8cm with renal parenchymal thickness of 58m on left and 129mon right. She has not seen a nephrologist. She denies any current GU symptoms.  Lab Results  Component Value Date   CREATININE/ EGFR 1.60 (H)  46 (L)  09/19/2017    Essential hypertension -     amLODipine (NORVASC) 10 MG tablet; Take 1 tablet (10 mg total) by mouth daily. -     losartan (COZAAR) 50 MG tablet; Take 1 tablet (50 mg total) by mouth daily. Continue all antihypertensives as prescribed.  Remember to bring in your blood pressure log with you for your follow up appointment.  DASH/Mediterranean Diets are healthier choices for HTN.    Patient has been counseled on age-appropriate routine health concerns for screening and prevention. These are reviewed and up-to-date. Referrals have been placed accordingly. Immunizations are up-to-date or declined.    Subjective:   Chief Complaint  Patient presents with  . Annual Exam    Patient is here for a physical.   . Back Pain    Patient stated her back hurts when she stand for too long and it's been going on for a month already.   . Medication Refill    Patient need medication refills.    HPI Samantha [id number]. female presents to office today for well woman exam. She is currently on her menstrual cycle so will reschedule PAP in 2 weeks. Her husband is here today along with their son.   Review of Systems  Constitutional: Negative.  Negative for chills, fever, malaise/fatigue and weight loss.  HENT: Negative.  Negative for congestion, hearing loss, sinus pain and sore throat.   Eyes: Negative.   Negative for blurred vision, double vision, photophobia and pain.  Respiratory: Negative.  Negative for cough, sputum production, shortness of breath and wheezing.   Cardiovascular: Negative.  Negative for chest pain and leg swelling.  Gastrointestinal: Negative.  Negative for abdominal pain, constipation, diarrhea, heartburn, nausea and vomiting.  Genitourinary: Negative.  Negative for dysuria, flank pain, frequency, hematuria and urgency.  Musculoskeletal: Positive for back pain (with prolonged standing). Negative for joint pain and myalgias.  Skin: Negative.  Negative for rash.  Neurological: Negative.  Negative for dizziness, tremors, speech change, focal weakness, seizures and headaches.  Endo/Heme/Allergies: Negative.  Negative for environmental allergies.  Psychiatric/Behavioral: Negative.  Negative for depression and suicidal ideas. The patient is not nervous/anxious and does not have insomnia.     Past Medical History:  Diagnosis Date  . Hypertension   . Renal disorder     History reviewed. No pertinent surgical history.  Family History  Problem Relation Age of Onset  . Hypertension Maternal Aunt     Social History Reviewed with no changes to be made today.   Outpatient Medications Prior to Visit  Medication Sig Dispense Refill  . amLODipine (NORVASC) 10 MG tablet Take 1 tablet (10 mg total) by mouth daily. 90 tablet 1  . losartan (COZAAR) 50 MG tablet Take 1 tablet (50 mg total) by mouth daily. 90 tablet 1   No facility-administered medications prior to visit.  No Known Allergies     Objective:    BP 127/86 (BP Location: Left Arm, Patient Position: Sitting, Cuff Size: Normal)   Pulse (!) 106   Temp 98.2 F (36.8 C) (Oral)   Ht 5' 5"  (1.651 m)   Wt 156 lb 3.2 oz (70.9 kg)   SpO2 98%   BMI 25.99 kg/m  Wt Readings from Last 3 Encounters:  12/15/17 156 lb 3.2 oz (70.9 kg)  10/13/17 152 lb 6.4 oz (69.1 kg)  03/26/17 154 lb 5.2 oz (70 kg)    Physical Exam   Constitutional: She is oriented to person, place, and time. She appears well-developed and well-nourished.  HENT:  Head: Normocephalic and atraumatic.  Right Ear: External ear normal.  Left Ear: External ear normal.  Nose: Nose normal.  Mouth/Throat: Oropharynx is clear and moist. No oropharyngeal exudate.  Eyes: Conjunctivae and EOM are normal. Pupils are equal, round, and reactive to light. Right eye exhibits no discharge. No scleral icterus.  Neck: Normal range of motion. Neck supple. No tracheal deviation present. No thyromegaly present.  Cardiovascular: Normal rate, regular rhythm, normal heart sounds and intact distal pulses. Exam reveals no friction rub.  No murmur heard. Pulmonary/Chest: Effort normal and breath sounds normal. No accessory muscle usage. No respiratory distress. She has no decreased breath sounds. She has no wheezes. She has no rhonchi. She has no rales. Chest wall is not dull to percussion. She exhibits no mass, no tenderness, no bony tenderness, no laceration, no crepitus, no edema, no deformity, no swelling and no retraction. Right breast exhibits tenderness (she is currently on her menstrual cycle). Right breast exhibits no inverted nipple, no mass, no nipple discharge and no skin change. Left breast exhibits tenderness (she is currently on her menstrual cycle). Left breast exhibits no inverted nipple, no mass, no nipple discharge and no skin change. Breasts are symmetrical.  Abdominal: Soft. Bowel sounds are normal. She exhibits no distension and no mass. There is no tenderness. There is no rebound and no guarding.  Musculoskeletal: Normal range of motion. She exhibits no edema, tenderness or deformity.  Lymphadenopathy:    She has no cervical adenopathy.  Neurological: She is alert and oriented to person, place, and time. She has normal reflexes. No cranial nerve deficit. Coordination normal.  Skin: Skin is warm and dry. No erythema.  Psychiatric: She has a normal  mood and affect. Her speech is normal and behavior is normal. Judgment and thought content normal.      Patient has been counseled extensively about nutrition and exercise as well as the importance of adherence with medications and regular follow-up. The patient was given clear instructions to go to ER or return to medical center if symptoms don't improve, worsen or new problems develop. The patient verbalized understanding.   Follow-up: Return in about 1 year (around 12/15/2018) for FASTING labs and Physical. PAP in 2 weeks  Gildardo Pounds, FNP-BC [facility], [place]   12/15/2017, 5:47 PM

## 2017-12-15 NOTE — Patient Instructions (Addendum)

## 2017-12-26 MED FILL — AMLODIPINE BESYLATE 10 MG T: 10 | 30 days supply | Qty: 30 | Fill #0

## 2017-12-26 MED FILL — LOSARTAN POTASSIUM 50 MG TA: 50 | 30 days supply | Qty: 30 | Fill #0

## 2018-01-02 ENCOUNTER — Ambulatory Visit: Payer: BLUE CROSS/BLUE SHIELD | Attending: Nurse Practitioner | Admitting: Nurse Practitioner

## 2018-01-02 ENCOUNTER — Encounter: Payer: Self-pay | Admitting: Nurse Practitioner

## 2018-01-02 VITALS — BP 114/80 | HR 94 | Temp 99.0°F | Ht 65.0 in | Wt 155.4 lb

## 2018-01-02 DIAGNOSIS — Z79899 Other long term (current) drug therapy: Secondary | ICD-10-CM | POA: Insufficient documentation

## 2018-01-02 DIAGNOSIS — Z124 Encounter for screening for malignant neoplasm of cervix: Secondary | ICD-10-CM

## 2018-01-02 DIAGNOSIS — I10 Essential (primary) hypertension: Secondary | ICD-10-CM | POA: Insufficient documentation

## 2018-01-02 NOTE — Progress Notes (Signed)
Assessment & Plan:  Samantha Barr was seen today for gynecologic exam.  Diagnoses and all orders for this visit:  Encounter for Papanicolaou smear of cervix -     Cytology - PAP   Patient has been counseled on age-appropriate routine health concerns for screening and prevention. These are reviewed and up-to-date. Referrals have been placed accordingly. Immunizations are up-to-date or declined.    Subjective:   Chief Complaint  Patient presents with  . Gynecologic Exam   HPI Samantha Barr 37 y.o. female presents to office today for routine PAP smear. She denies any abdominal pain, dysuria, flank pain or any other GU symptoms today.   Review of Systems  Constitutional: Negative.  Negative for chills, fever, malaise/fatigue and weight loss.  Respiratory: Negative.  Negative for cough, shortness of breath and wheezing.   Cardiovascular: Negative.  Negative for chest pain, orthopnea and leg swelling.  Gastrointestinal: Negative for abdominal pain.  Genitourinary: Positive for flank pain.  Skin: Negative.  Negative for rash.  Psychiatric/Behavioral: Negative for suicidal ideas.    Past Medical History:  Diagnosis Date  . Hypertension   . Renal disorder     History reviewed. No pertinent surgical history.  Family History  Problem Relation Age of Onset  . Hypertension Maternal Aunt     Social History Reviewed with no changes to be made today.   Outpatient Medications Prior to Visit  Medication Sig Dispense Refill  . amLODipine (NORVASC) 10 MG tablet Take 1 tablet (10 mg total) by mouth daily. 90 tablet 1  . losartan (COZAAR) 50 MG tablet Take 1 tablet (50 mg total) by mouth daily. 90 tablet 1   No facility-administered medications prior to visit.     No Known Allergies     Objective:    BP 114/80 (BP Location: Right Arm, Patient Position: Sitting, Cuff Size: Normal)   Pulse 94   Temp 99 F (37.2 C) (Oral)   Ht 5\' 5"  (1.651 m)   Wt 155 lb 6.4 oz (70.5 kg)   LMP  12/08/2017   SpO2 97%   BMI 25.86 kg/m  Wt Readings from Last 3 Encounters:  01/02/18 155 lb 6.4 oz (70.5 kg)  12/15/17 156 lb 3.2 oz (70.9 kg)  10/13/17 152 lb 6.4 oz (69.1 kg)    Physical Exam  Constitutional: She is oriented to person, place, and time. She appears well-developed and well-nourished.  HENT:  Head: Normocephalic.  Cardiovascular: Normal rate, regular rhythm and normal heart sounds.  Pulmonary/Chest: Effort normal and breath sounds normal.  Abdominal: Soft. Bowel sounds are normal. Hernia confirmed negative in the right inguinal area and confirmed negative in the left inguinal area.  Genitourinary: Vagina normal and uterus normal. Rectal exam shows no external hemorrhoid. There is no rash, tenderness, lesion or injury on the right labia. There is no rash, tenderness, lesion or injury on the left labia. Uterus is not enlarged, not fixed and not tender. Cervix exhibits no motion tenderness, no discharge and no friability. Right adnexum displays no mass, no tenderness and no fullness. Left adnexum displays no mass, no tenderness and no fullness. No erythema, tenderness or bleeding in the vagina. No foreign body in the vagina. No signs of injury around the vagina. No vaginal discharge found.  Lymphadenopathy:       Right: No inguinal adenopathy present.       Left: No inguinal adenopathy present.  Neurological: She is alert and oriented to person, place, and time.  Skin: Skin is warm and  dry.  Psychiatric: She has a normal mood and affect. Her behavior is normal. Judgment and thought content normal.       Patient has been counseled extensively about nutrition and exercise as well as the importance of adherence with medications and regular follow-up. The patient was given clear instructions to go to ER or return to medical center if symptoms don't improve, worsen or new problems develop. The patient verbalized understanding.   Follow-up: Return in about 2 months (around  03/04/2018), or if symptoms worsen or fail to improve, for BP recheck.   Gildardo Pounds, FNP-BC [facility] and [facility], [place]   01/02/2018, 2:41 PM

## 2018-01-04 LAB — CYTOLOGY - PAP
DIAGNOSIS: NEGATIVE
HPV: NOT DETECTED

## 2018-01-09 ENCOUNTER — Telehealth: Payer: Self-pay

## 2018-01-09 NOTE — Telephone Encounter (Signed)
-----   Message from Gildardo Pounds, NP sent at 01/08/2018  6:26 PM EDT ----- Pas smear was normal. Next PAP due in 3 years

## 2018-01-09 NOTE — Telephone Encounter (Signed)
CMA spoke to patient's husband and inform on lab results.  Patient will be inform by husband.

## 2018-03-05 ENCOUNTER — Encounter: Payer: Self-pay | Admitting: Nurse Practitioner

## 2018-03-05 ENCOUNTER — Ambulatory Visit: Payer: BLUE CROSS/BLUE SHIELD | Attending: Nurse Practitioner | Admitting: Nurse Practitioner

## 2018-03-05 VITALS — BP 120/78 | HR 91 | Temp 99.0°F | Ht 65.0 in | Wt 154.4 lb

## 2018-03-05 DIAGNOSIS — Z79899 Other long term (current) drug therapy: Secondary | ICD-10-CM | POA: Insufficient documentation

## 2018-03-05 DIAGNOSIS — N183 Chronic kidney disease, stage 3 unspecified: Secondary | ICD-10-CM | POA: Insufficient documentation

## 2018-03-05 DIAGNOSIS — R0602 Shortness of breath: Secondary | ICD-10-CM | POA: Diagnosis present

## 2018-03-05 DIAGNOSIS — I10 Essential (primary) hypertension: Secondary | ICD-10-CM | POA: Diagnosis not present

## 2018-03-05 DIAGNOSIS — I129 Hypertensive chronic kidney disease with stage 1 through stage 4 chronic kidney disease, or unspecified chronic kidney disease: Secondary | ICD-10-CM | POA: Diagnosis not present

## 2018-03-05 DIAGNOSIS — Z8249 Family history of ischemic heart disease and other diseases of the circulatory system: Secondary | ICD-10-CM | POA: Diagnosis not present

## 2018-03-05 NOTE — Patient Instructions (Signed)

## 2018-03-05 NOTE — Progress Notes (Signed)
Assessment & Plan:  Samantha Barr was seen today for blood pressure check and shortness of breath.  Diagnoses and all orders for this visit:  Essential hypertension Continue losartan and amlodipine as prescribed. Continue all antihypertensives as prescribed.  Remember to bring in your blood pressure log with you for your follow up appointment.  DASH/Mediterranean Diets are healthier choices for HTN.    Stage 3 chronic kidney disease ([place]) -     CMP14+EGFR    Patient has been counseled on age-appropriate routine health concerns for screening and prevention. These are reviewed and up-to-date. Referrals have been placed accordingly. Immunizations are up-to-date or declined.    Subjective:   Chief Complaint  Patient presents with  . Blood Pressure Check    Pt. is here for blood pressure check.  . Shortness of Breath    Pt. stated that two weeks ago she had shortness of breath.    HPI Samantha Barr 37 y.o. female presents to office today for blood pressure follow up. She is accompanied by her husband today. She is requesting to fast for the next 30 days due to Ramadan. I have instructed her that I would not agree with fasting due to her history of CKD. She endorses an episode of shortness of breath for 3 days a few weeks ago. She had been outside as well so likely could have been related to environmental allergens. She currently denies any shortness of breath and there was no chest pain associated with the shortness of breath a few weeks ago   Essential Hypertension Stable and well controlled. Taking medications as prescribed: amlodipine 43m and Losartan 567mdaily. Denies chest pain, shortness of breath, palpitations, lightheadedness, dizziness, headaches or BLE edema.  BP Readings from Last 3 Encounters:  03/05/18 120/78  01/02/18 114/80  12/15/17 127/86   CKD stage 3 Sees nephrology on May 28th. She has a history of CKD. Per notes reviewed on 09-19-2017:[place] and the doctor told her she had a problem with her kidneys. She currently denies dysuria, flank pain, hematuria, back pain or nausea with vomiting.  Lab Results  Component Value Date   CREATININE 1.60 (H) 09/19/2017     Review of Systems  Constitutional: Negative for fever, malaise/fatigue and weight loss.  HENT: Negative.  Negative for nosebleeds.   Eyes: Negative.  Negative for blurred vision, double vision and photophobia.  Respiratory: Negative.  Negative for cough and shortness of breath.   Cardiovascular: Negative.  Negative for chest pain, palpitations and leg swelling.  Gastrointestinal: Negative.  Negative for heartburn, nausea and vomiting.  Musculoskeletal: Negative.  Negative for myalgias.  Neurological: Negative.  Negative for dizziness, focal weakness, seizures and headaches.  Psychiatric/Behavioral: Negative.  Negative for suicidal ideas.    Past Medical History:  Diagnosis Date  . Hypertension   . Renal disorder     History reviewed. No pertinent surgical history.  Family History  Problem Relation Age of Onset  . Hypertension Maternal Aunt     Social History Reviewed with no changes to be made today.   Outpatient Medications Prior to Visit  Medication Sig Dispense Refill  . amLODipine (NORVASC) 10 MG tablet Take 1 tablet (10 mg total) by mouth daily. 90 tablet 1  . losartan (COZAAR) 50 MG tablet Take 1 tablet (50 mg total) by mouth daily. 90 tablet 1   No facility-administered medications prior to visit.     No Known Allergies  Objective:    BP 120/78 (BP Location: Right Arm, Patient Position: Sitting, Cuff Size: Normal)   Pulse 91   Temp 99 F (37.2 C) (Oral)   Ht 5' 5"  (1.651 m)   Wt 154 lb 6.4 oz (70 kg)   SpO2 96%   BMI 25.69 kg/m  Wt Readings from Last 3 Encounters:  03/05/18 154 lb 6.4 oz (70 kg)  01/02/18 155 lb 6.4 oz (70.5 kg)  12/15/17 156 lb 3.2 oz (70.9 kg)    Physical Exam    Constitutional: She is oriented to person, place, and time. She appears well-developed and well-nourished. She is cooperative.  HENT:  Head: Normocephalic and atraumatic.  Eyes: EOM are normal.  Neck: Normal range of motion.  Cardiovascular: Normal rate, regular rhythm, normal heart sounds and intact distal pulses. Exam reveals no gallop and no friction rub.  No murmur heard. Pulmonary/Chest: Effort normal and breath sounds normal. No tachypnea. No respiratory distress. She has no decreased breath sounds. She has no wheezes. She has no rhonchi. She has no rales. She exhibits no tenderness.  Abdominal: Soft. Bowel sounds are normal.  Musculoskeletal: Normal range of motion. She exhibits no edema.  Neurological: She is alert and oriented to person, place, and time. Coordination normal.  Skin: Skin is warm and dry.  Psychiatric: She has a normal mood and affect. Her behavior is normal. Judgment and thought content normal.  Nursing note and vitals reviewed.        Patient has been counseled extensively about nutrition and exercise as well as the importance of adherence with medications and regular follow-up. The patient was given clear instructions to go to ER or return to medical center if symptoms don't improve, worsen or new problems develop. The patient verbalized understanding.   Follow-up: Return in about 3 months (around 06/05/2018) for HTN, CKD.   Gildardo Pounds, FNP-BC [facility] and [facility], [place]   03/05/2018, 2:16 PM

## 2018-03-06 LAB — CMP14+EGFR
ALT: 10 IU/L (ref 0–32)
AST: 14 IU/L (ref 0–40)
Albumin/Globulin Ratio: 1.6 (ref 1.2–2.2)
Albumin: 4.3 g/dL (ref 3.5–5.5)
Alkaline Phosphatase: 84 IU/L (ref 39–117)
BILIRUBIN TOTAL: 0.3 mg/dL (ref 0.0–1.2)
BUN/Creatinine Ratio: 13 (ref 9–23)
BUN: 21 mg/dL — ABNORMAL HIGH (ref 6–20)
CALCIUM: 8.9 mg/dL (ref 8.7–10.2)
CHLORIDE: 108 mmol/L — AB (ref 96–106)
CO2: 21 mmol/L (ref 20–29)
Creatinine, Ser: 1.6 mg/dL — ABNORMAL HIGH (ref 0.57–1.00)
GFR calc non Af Amer: 40 mL/min/{1.73_m2} — ABNORMAL LOW (ref >59)
GFR, EST AFRICAN AMERICAN: 46 mL/min/{1.73_m2} — AB (ref >59)
GLUCOSE: 76 mg/dL (ref 65–99)
Globulin, Total: 2.7 g/dL (ref 1.5–4.5)
Potassium: 4.6 mmol/L (ref 3.5–5.2)
Sodium: 143 mmol/L (ref 134–144)
TOTAL PROTEIN: 7 g/dL (ref 6.0–8.5)

## 2018-03-08 ENCOUNTER — Telehealth: Payer: Self-pay

## 2018-03-08 NOTE — Telephone Encounter (Signed)
CMA spoke to patient husband and inform lab results.  Patient husband will inform patient.

## 2018-03-08 NOTE — Telephone Encounter (Signed)
-----   Message from Gildardo Pounds, NP sent at 03/06/2018  8:46 AM EDT ----- Kidney function although stable is still below normal. Continue with follow up: Nephrology at the end of this month. No fasting for Ramadan.

## 2018-04-06 ENCOUNTER — Other Ambulatory Visit: Payer: Self-pay | Admitting: Nephrology

## 2018-04-06 DIAGNOSIS — N183 Chronic kidney disease, stage 3 unspecified: Secondary | ICD-10-CM

## 2018-04-12 MED FILL — LOSARTAN POTASSIUM 50 MG TA: 50 | 30 days supply | Qty: 30 | Fill #1

## 2018-04-12 MED FILL — AMLODIPINE BESYLATE 10 MG T: 10 | 30 days supply | Qty: 30 | Fill #1

## 2018-04-23 ENCOUNTER — Other Ambulatory Visit: Payer: BLUE CROSS/BLUE SHIELD

## 2018-05-01 ENCOUNTER — Ambulatory Visit
Admission: RE | Admit: 2018-05-01 | Discharge: 2018-05-01 | Disposition: A | Discharge status: Home / Self Care | Payer: BLUE CROSS/BLUE SHIELD | Source: Ambulatory Visit | Attending: Nephrology | Admitting: Nephrology

## 2018-05-01 DIAGNOSIS — N183 Chronic kidney disease, stage 3 unspecified: Secondary | ICD-10-CM

## 2018-05-14 MED FILL — AMLODIPINE BESYLATE 10 MG T: 10 | 30 days supply | Qty: 30 | Fill #2

## 2018-05-14 MED FILL — LOSARTAN POTASSIUM 50 MG TA: 50 | 30 days supply | Qty: 30 | Fill #2

## 2018-05-25 ENCOUNTER — Ambulatory Visit: Payer: Self-pay | Attending: Nurse Practitioner

## 2018-06-05 ENCOUNTER — Ambulatory Visit: Payer: Self-pay | Admitting: Nurse Practitioner

## 2018-06-13 ENCOUNTER — Ambulatory Visit: Payer: BLUE CROSS/BLUE SHIELD | Attending: Nurse Practitioner | Admitting: Nurse Practitioner

## 2018-06-13 ENCOUNTER — Encounter: Payer: Self-pay | Admitting: Nurse Practitioner

## 2018-06-13 VITALS — BP 128/73 | HR 93 | Temp 97.5°F | Resp 12 | Ht 65.0 in | Wt 155.4 lb

## 2018-06-13 DIAGNOSIS — Z79899 Other long term (current) drug therapy: Secondary | ICD-10-CM | POA: Insufficient documentation

## 2018-06-13 DIAGNOSIS — J029 Acute pharyngitis, unspecified: Secondary | ICD-10-CM | POA: Insufficient documentation

## 2018-06-13 DIAGNOSIS — I10 Essential (primary) hypertension: Secondary | ICD-10-CM | POA: Diagnosis present

## 2018-06-13 DIAGNOSIS — R0981 Nasal congestion: Secondary | ICD-10-CM | POA: Diagnosis not present

## 2018-06-13 DIAGNOSIS — M25531 Pain in right wrist: Secondary | ICD-10-CM | POA: Diagnosis not present

## 2018-06-13 DIAGNOSIS — N289 Disorder of kidney and ureter, unspecified: Secondary | ICD-10-CM | POA: Insufficient documentation

## 2018-06-13 MED ORDER — MISC. DEVICES MISC: 0 refills | Status: DC

## 2018-06-13 MED ORDER — LOSARTAN POTASSIUM 50 MG PO TABS: 50.0000 mg | ORAL_TABLET | Freq: Every day | ORAL | 1 refills | Status: DC

## 2018-06-13 MED ORDER — AMLODIPINE BESYLATE 10 MG PO TABS: 10.0000 mg | ORAL_TABLET | Freq: Every day | ORAL | 1 refills | Status: DC

## 2018-06-13 MED ORDER — IBUPROFEN 800 MG PO TABS: 800.0000 mg | ORAL_TABLET | Freq: Three times a day (TID) | ORAL | 0 refills | Status: DC | PRN

## 2018-06-13 MED ORDER — FLUTICASONE PROPIONATE 50 MCG/ACT NA SUSP: 1.0000 | Freq: Every day | NASAL | 2 refills | Status: DC

## 2018-06-13 NOTE — Progress Notes (Signed)
Assessment & Plan:  [name] was seen today for follow-up.  Diagnoses and all orders for this visit:  Essential hypertension -     losartan (COZAAR) 50 MG tablet; Take 1 tablet (50 mg total) by mouth daily. -     amLODipine (NORVASC) 10 MG tablet; Take 1 tablet (10 mg total) by mouth daily. Continue all antihypertensives as prescribed.  Remember to bring in your blood pressure log with you for your follow up appointment.  DASH/Mediterranean Diets are healthier choices for HTN.   Nasal congestion -     fluticasone (FLONASE) 50 MCG/ACT nasal spray; Place 1 spray into both nostrils daily.  Right wrist pain -     Misc. Devices MISC; Please provide patient with an insurance approved right wrist/hand splint. -     ibuprofen (ADVIL,MOTRIN) 800 MG tablet; Take 1 tablet (800 mg total) by mouth every 8 (eight) hours as needed.    Patient has been counseled on age-appropriate routine health concerns for screening and prevention. These are reviewed and up-to-date. Referrals have been placed accordingly. Immunizations are up-to-date or declined.    Subjective:   Chief Complaint  Patient presents with  . Follow-up    Pt. is here to follow-up on hypertension. Pt. stated she seen her Kidney specialist and have an upcoming appt. with them in September.    HPI Samantha Barr 37 y.o. female presents to office today for follow up to HTN. She has complaints of nasal congestion and right wrist pain as well today.   CHRONIC HYPERTENSION Disease Monitoring  Blood pressure range: Chronic and well controlled.  BP Readings from Last 3 Encounters:  06/13/18 128/73  03/05/18 120/78  01/02/18 114/80   Chest pain: no   Dyspnea: no   Claudication: no  Medication compliance: yes, taking amlodipine 10mg  and losartan 50mg  daily.   Medication Side Effects  Lightheadedness: no   Urinary frequency: no   Edema: no   Impotence: no  Preventitive Healthcare:  Exercise: no   Diet Pattern: diet:  general  Salt Restriction:  no    Upper Respiratory Infection: Patient complains of symptoms of a URI. Symptoms include congestion and sore throat. Onset of symptoms was 1 week ago, gradually worsening since that time. She also c/o nasal congestion and post nasal drip for the past 1 week .  She is drinking plenty of fluids. Evaluation to date: none. Treatment to date: none.  Wrist Pain Patient complaints of right wrist pain. The pain began a few years ago. The pain is located primarily in the dorsal area.  She describes the symptoms as aching, shooting and stabbing. Symptoms improve with rest. Pain radiates to hands and fingers and occurs daily. The symptoms are worse with flexion, extension, rotation and movement. The patient  does not have neck pain. The patient is active in none. Treatment to date has been NOTHING.   Review of Systems  Constitutional: Negative for fever, malaise/fatigue and weight loss.  HENT: Positive for congestion and sore throat. Negative for nosebleeds.   Eyes: Negative.  Negative for blurred vision, double vision and photophobia.  Respiratory: Positive for cough. Negative for shortness of breath.   Cardiovascular: Negative.  Negative for chest pain, palpitations and leg swelling.  Gastrointestinal: Negative.  Negative for heartburn, nausea and vomiting.  Musculoskeletal: Positive for joint pain. Negative for myalgias.       SEE HPI  Neurological: Positive for headaches. Negative for dizziness, focal weakness and seizures.  Psychiatric/Behavioral: Negative.  Negative for suicidal ideas.  Past Medical History:  Diagnosis Date  . Hypertension   . Renal disorder     History reviewed. No pertinent surgical history.  Family History  Problem Relation Age of Onset  . Hypertension Maternal Aunt     Social History Reviewed with no changes to be made today.   Outpatient Medications Prior to Visit  Medication Sig Dispense Refill  . amLODipine (NORVASC) 10 MG  tablet Take 1 tablet (10 mg total) by mouth daily. 90 tablet 1  . losartan (COZAAR) 50 MG tablet Take 1 tablet (50 mg total) by mouth daily. 90 tablet 1   No facility-administered medications prior to visit.     No Known Allergies     Objective:    BP 128/73 (BP Location: Right Arm, Patient Position: Sitting, Cuff Size: Normal)   Pulse 93   Temp (!) 97.5 F (36.4 C) (Oral)   Ht 5\' 5"  (1.651 m)   Wt 155 lb 6.4 oz (70.5 kg)   LMP 05/19/2018   SpO2 96%   BMI 25.86 kg/m  Wt Readings from Last 3 Encounters:  06/13/18 155 lb 6.4 oz (70.5 kg)  03/05/18 154 lb 6.4 oz (70 kg)  01/02/18 155 lb 6.4 oz (70.5 kg)    Physical Exam  Constitutional: She is oriented to person, place, and time. She appears well-developed and well-nourished. She is cooperative.  HENT:  Head: Normocephalic and atraumatic.  Right Ear: A middle ear effusion is present.  Left Ear: A middle ear effusion is present.  Nose: Mucosal edema and rhinorrhea present.  Mouth/Throat: Posterior oropharyngeal edema and posterior oropharyngeal erythema present.  Eyes: EOM are normal.  Neck: Normal range of motion.  Cardiovascular: Normal rate, regular rhythm, normal heart sounds and intact distal pulses. Exam reveals no gallop and no friction rub.  No murmur heard. Pulmonary/Chest: Effort normal and breath sounds normal. No stridor. No tachypnea. No respiratory distress. She has no decreased breath sounds. She has no wheezes. She has no rhonchi. She has no rales. She exhibits no tenderness.  Abdominal: Soft. Bowel sounds are normal.  Musculoskeletal: She exhibits no edema.       Right wrist: She exhibits decreased range of motion and tenderness. She exhibits no swelling, no crepitus and no deformity.  Neurological: She is alert and oriented to person, place, and time. Coordination normal.  Skin: Skin is warm and dry.  Psychiatric: She has a normal mood and affect. Her behavior is normal. Judgment and thought content normal.   Nursing note and vitals reviewed.      Patient has been counseled extensively about nutrition and exercise as well as the importance of adherence with medications and regular follow-up. The patient was given clear instructions to go to ER or return to medical center if symptoms don't improve, worsen or new problems develop. The patient verbalized understanding.   Follow-up: Return in about 3 months (around 09/13/2018) for HTN FASTING LABS.   Gildardo Pounds, FNP-BC [facility] and [facility], [place]   06/13/2018, 8:44 PM

## 2018-07-25 ENCOUNTER — Ambulatory Visit: Payer: Self-pay | Attending: Nurse Practitioner

## 2018-09-14 ENCOUNTER — Ambulatory Visit: Payer: BLUE CROSS/BLUE SHIELD | Admitting: Nurse Practitioner

## 2018-09-17 ENCOUNTER — Encounter: Payer: Self-pay | Admitting: Nurse Practitioner

## 2018-09-17 ENCOUNTER — Ambulatory Visit: Payer: Self-pay | Attending: Nurse Practitioner | Admitting: Nurse Practitioner

## 2018-09-17 VITALS — BP 112/78 | HR 90 | Temp 98.8°F | Ht 65.0 in | Wt 157.0 lb

## 2018-09-17 DIAGNOSIS — I10 Essential (primary) hypertension: Secondary | ICD-10-CM | POA: Insufficient documentation

## 2018-09-17 DIAGNOSIS — Z791 Long term (current) use of non-steroidal anti-inflammatories (NSAID): Secondary | ICD-10-CM | POA: Insufficient documentation

## 2018-09-17 DIAGNOSIS — Z79899 Other long term (current) drug therapy: Secondary | ICD-10-CM | POA: Insufficient documentation

## 2018-09-17 DIAGNOSIS — M25531 Pain in right wrist: Secondary | ICD-10-CM | POA: Insufficient documentation

## 2018-09-17 DIAGNOSIS — Z8249 Family history of ischemic heart disease and other diseases of the circulatory system: Secondary | ICD-10-CM | POA: Insufficient documentation

## 2018-09-17 DIAGNOSIS — E782 Mixed hyperlipidemia: Secondary | ICD-10-CM | POA: Insufficient documentation

## 2018-09-17 MED ORDER — LOSARTAN POTASSIUM 100 MG PO TABS
100.0000 mg | ORAL_TABLET | Freq: Every day | ORAL | 1 refills | Status: DC
Start: 2018-09-17 — End: 2019-08-09

## 2018-09-17 MED ORDER — AMLODIPINE BESYLATE 10 MG PO TABS: 10.0000 mg | ORAL_TABLET | Freq: Every day | ORAL | 1 refills | Status: DC

## 2018-09-17 NOTE — Progress Notes (Signed)
Assessment & Plan:  Samantha Barr was seen today for follow-up.  Diagnoses and all orders for this visit:  Essential hypertension -     losartan (COZAAR) 100 MG tablet; Take 1 tablet (100 mg total) by mouth daily. -     amLODipine (NORVASC) 10 MG tablet; Take 1 tablet (10 mg total) by mouth daily. Continue all antihypertensives as prescribed.  Remember to bring in your blood pressure log with you for your follow up appointment.  DASH/Mediterranean Diets are healthier choices for HTN.    Mixed hyperlipidemia -     Lipid panel INSTRUCTIONS: Work on a low fat, heart healthy diet and participate in regular aerobic exercise program by working out at least 150 minutes per week; 5 days a week-30 minutes per day. Avoid red meat, fried foods. junk foods, sodas, sugary drinks, unhealthy snacking, alcohol and smoking.  Drink at least 48oz of water per day and monitor your carbohydrate intake daily.    Right wrist pain Wrist splint given. Patient has been advised to apply for financial assistance and schedule to see our financial counselor.    Patient has been counseled on age-appropriate routine health concerns for screening and prevention. These are reviewed and up-to-date. Referrals have been placed accordingly. Immunizations are up-to-date or declined.    Subjective:   Chief Complaint  Patient presents with  . Follow-up    Pt. is here to follow-up on hypertension. Pt. stated she already ate.    HPI Samantha Barr 37 y.o. female presents to office today for follow up to HTN. She also has complaints of right wrist pain.  Wrist Pain: Patient complaints of right wrist pain. She denies any injury.  The pain began several months ago. The pain is located primarily in the ulnar area.  She describes the symptoms as numbing, sharp, stabbing and throbbing. Symptoms improve with rest or cessation of activity. The symptoms are worse with flexion and rotation. The patient  does not have neck pain. The  patient is active in none. Treatment to date has been none. I have instructed her to wear a right wrist splint for the next 6 weeks. She can not take NSAIDs due to renal issues. Patient has been advised to apply for financial assistance and schedule to see our financial counselor.    CHRONIC HYPERTENSION Disease Monitoring  Blood pressure well controlled today. BP Readings from Last 3 Encounters:  09/17/18 112/78  06/13/18 128/73  03/05/18 120/78    Chest pain: no   Dyspnea: no   Claudication: no  Medication compliance: yes, taking losartan 100mg  and amlodipine 10mg    Medication Side Effects  Lightheadedness: no   Urinary frequency: no   Edema: no   Impotence: no  Preventitive Healthcare:  Exercise: no   Diet Pattern: diet: general  Salt Restriction:  no    Review of Systems  Constitutional: Negative for fever, malaise/fatigue and weight loss.  HENT: Negative.  Negative for nosebleeds.   Eyes: Negative.  Negative for blurred vision, double vision and photophobia.  Respiratory: Negative.  Negative for cough and shortness of breath.   Cardiovascular: Negative.  Negative for chest pain, palpitations and leg swelling.  Gastrointestinal: Negative.  Negative for heartburn, nausea and vomiting.  Musculoskeletal: Positive for joint pain. Negative for myalgias.  Neurological: Negative.  Negative for dizziness, focal weakness, seizures and headaches.  Psychiatric/Behavioral: Negative.  Negative for suicidal ideas.    Past Medical History:  Diagnosis Date  . Hypertension   . Renal disorder  History reviewed. No pertinent surgical history.  Family History  Problem Relation Age of Onset  . Hypertension Maternal Aunt     Social History Reviewed with no changes to be made today.   Outpatient Medications Prior to Visit  Medication Sig Dispense Refill  . ibuprofen (ADVIL,MOTRIN) 800 MG tablet Take 1 tablet (800 mg total) by mouth every 8 (eight) hours as needed. 30 tablet 0    . fluticasone (FLONASE) 50 MCG/ACT nasal spray Place 1 spray into both nostrils daily. 16 g 2  . Misc. Devices MISC Please provide patient with an insurance approved right wrist/hand splint. 1 each 0  . amLODipine (NORVASC) 10 MG tablet Take 1 tablet (10 mg total) by mouth daily. 90 tablet 1  . losartan (COZAAR) 50 MG tablet Take 1 tablet (50 mg total) by mouth daily. 90 tablet 1   No facility-administered medications prior to visit.     No Known Allergies     Objective:    BP 112/78 (BP Location: Right Arm, Patient Position: Sitting, Cuff Size: Normal)   Pulse 90   Temp 98.8 F (37.1 C) (Oral)   Ht 5\' 5"  (1.651 m)   Wt 157 lb (71.2 kg)   LMP 09/03/2018   SpO2 97%   BMI 26.13 kg/m  Wt Readings from Last 3 Encounters:  09/17/18 157 lb (71.2 kg)  06/13/18 155 lb 6.4 oz (70.5 kg)  03/05/18 154 lb 6.4 oz (70 kg)    Physical Exam  Constitutional: She is oriented to person, place, and time. She appears well-developed and well-nourished. She is cooperative.  HENT:  Head: Normocephalic and atraumatic.  Eyes: EOM are normal.  Neck: Normal range of motion.  Cardiovascular: Normal rate, regular rhythm and normal heart sounds. Exam reveals no gallop and no friction rub.  No murmur heard. Pulmonary/Chest: Effort normal and breath sounds normal. No tachypnea. No respiratory distress. She has no decreased breath sounds. She has no wheezes. She has no rhonchi. She has no rales. She exhibits no tenderness.  Abdominal: Bowel sounds are normal.  Musculoskeletal: Normal range of motion. She exhibits no edema.       Right wrist: She exhibits tenderness (present in ulnar area with passive inversion and dorsiflexion of right hand). She exhibits normal range of motion.  Negative phalen  Neurological: She is alert and oriented to person, place, and time. Coordination normal.  Skin: Skin is warm and dry.  Psychiatric: She has a normal mood and affect. Her behavior is normal. Judgment and thought  content normal.  Nursing note and vitals reviewed.      Patient has been counseled extensively about nutrition and exercise as well as the importance of adherence with medications and regular follow-up. The patient was given clear instructions to go to ER or return to medical center if symptoms don't improve, worsen or new problems develop. The patient verbalized understanding.   Follow-up: No follow-ups on file.   Gildardo Pounds, FNP-BC [facility] and [facility], [place]   09/17/2018, 2:26 PM

## 2018-09-18 LAB — LIPID PANEL
CHOLESTEROL TOTAL: 234 mg/dL — AB (ref 100–199)
Chol/HDL Ratio: 7.3 ratio — ABNORMAL HIGH (ref 0.0–4.4)
HDL: 32 mg/dL — ABNORMAL LOW (ref >39)
TRIGLYCERIDES: 442 mg/dL — AB (ref 0–149)

## 2018-09-25 ENCOUNTER — Other Ambulatory Visit: Payer: Self-pay | Admitting: Nurse Practitioner

## 2018-09-25 ENCOUNTER — Telehealth: Payer: Self-pay

## 2018-09-25 MED ORDER — ROSUVASTATIN CALCIUM 10 MG PO TABS: 10.0000 mg | ORAL_TABLET | Freq: Every day | ORAL | 3 refills | Status: DC

## 2018-09-25 MED FILL — ROSUVASTATIN CALCIUM 10 MG: 10 | 30 days supply | Qty: 30 | Fill #0

## 2018-09-25 NOTE — Telephone Encounter (Signed)
-----   Message from Gildardo Pounds, NP sent at 09/25/2018  9:00 AM EST ----- Cholesterol levels are extremely elevated.  Will need to start on a cholesterol-lowering medication which will be sent to the pharmacy for you to pick up today.  We will follow back up on cholesterol levels in 3 months.

## 2018-09-25 NOTE — Telephone Encounter (Signed)
CMA spoke to patient's husband who is the emergency contact to inform on results and Rx.

## 2018-10-09 MED FILL — LOSARTAN POTASSIUM 100 MG T: 100 | 30 days supply | Qty: 30 | Fill #0

## 2018-10-09 MED FILL — AMLODIPINE BESYLATE 10 MG T: 10 | 30 days supply | Qty: 30 | Fill #0

## 2018-10-09 MED FILL — ROSUVASTATIN CALCIUM 10 MG: 10 | 30 days supply | Qty: 30 | Fill #0

## 2018-11-29 MED FILL — LOSARTAN POTASSIUM 100 MG T: 100 | 30 days supply | Qty: 30 | Fill #1

## 2018-11-29 MED FILL — ROSUVASTATIN CALCIUM 10 MG: 10 | 30 days supply | Qty: 30 | Fill #1

## 2018-11-29 MED FILL — AMLODIPINE BESYLATE 10 MG T: 10 | 30 days supply | Qty: 30 | Fill #1

## 2018-12-18 ENCOUNTER — Ambulatory Visit: Payer: Self-pay | Admitting: Nurse Practitioner

## 2019-02-04 MED FILL — LOSARTAN POTASSIUM 100 MG T: 100 | 30 days supply | Qty: 30 | Fill #0

## 2019-02-04 MED FILL — ?AMLODIPINE BESYLATE 10 MG: 10 | 30 days supply | Qty: 30 | Fill #0

## 2019-02-05 MED FILL — ROSUVASTATIN CALCIUM 10 MG: 10 | 30 days supply | Qty: 30 | Fill #2

## 2019-05-07 MED FILL — LOSARTAN POTASSIUM 100 MG T: 100 | 30 days supply | Qty: 30 | Fill #1

## 2019-05-07 MED FILL — ?AMLODIPINE BESYLATE 10 MG: 10 | 30 days supply | Qty: 30 | Fill #1

## 2019-05-07 MED FILL — ROSUVASTATIN CALCIUM 10 MG: 10 | 30 days supply | Qty: 30 | Fill #3

## 2019-05-08 MED FILL — LOSARTAN POTASSIUM 100 MG T: 100 | 30 days supply | Qty: 30 | Fill #2

## 2019-05-16 MED FILL — ROSUVASTATIN CALCIUM 10 MG: 10 | 30 days supply | Qty: 30 | Fill #3

## 2019-05-16 MED FILL — ?AMLODIPINE BESYLATE 10 MG: 10 | 30 days supply | Qty: 30 | Fill #1

## 2019-07-12 MED FILL — LOSARTAN POTASSIUM 100 MG T: 100 | 30 days supply | Qty: 30 | Fill #3

## 2019-07-12 MED FILL — ?AMLODIPINE BESYLATE 10 MG: 10 | 30 days supply | Qty: 30 | Fill #2

## 2019-07-12 MED FILL — ROSUVASTATIN CALCIUM 10 MG: 10 | 30 days supply | Qty: 30 | Fill #4

## 2019-07-23 ENCOUNTER — Ambulatory Visit: Payer: Self-pay

## 2019-08-09 ENCOUNTER — Other Ambulatory Visit: Payer: Self-pay

## 2019-08-09 ENCOUNTER — Ambulatory Visit: Payer: Self-pay | Attending: Nurse Practitioner | Admitting: Nurse Practitioner

## 2019-08-09 ENCOUNTER — Encounter: Payer: Self-pay | Admitting: Nurse Practitioner

## 2019-08-09 ENCOUNTER — Ambulatory Visit: Payer: Self-pay

## 2019-08-09 VITALS — BP 121/84 | HR 96 | Temp 98.4°F | Ht 65.0 in | Wt 156.0 lb

## 2019-08-09 DIAGNOSIS — M79671 Pain in right foot: Secondary | ICD-10-CM

## 2019-08-09 DIAGNOSIS — E782 Mixed hyperlipidemia: Secondary | ICD-10-CM

## 2019-08-09 DIAGNOSIS — N1832 Chronic kidney disease, stage 3b: Secondary | ICD-10-CM

## 2019-08-09 DIAGNOSIS — I10 Essential (primary) hypertension: Secondary | ICD-10-CM

## 2019-08-09 MED ORDER — AMLODIPINE BESYLATE 10 MG PO TABS: 10.0000 mg | ORAL_TABLET | Freq: Every day | ORAL | 0 refills | Status: DC

## 2019-08-09 MED ORDER — DICLOFENAC SODIUM 1 % TD GEL: 2.0000 g | Freq: Four times a day (QID) | TRANSDERMAL | 1 refills | Status: AC

## 2019-08-09 MED ORDER — LOSARTAN POTASSIUM 100 MG PO TABS: 100.0000 mg | ORAL_TABLET | Freq: Every day | ORAL | 0 refills | Status: DC

## 2019-08-09 MED FILL — LOSARTAN POTASSIUM 100 MG T: 100 | 30 days supply | Qty: 30 | Fill #4

## 2019-08-09 MED FILL — DICLOFENAC SODIUM 1% GEL: 1 | 25 days supply | Qty: 100 | Fill #0

## 2019-08-09 MED FILL — ?AMLODIPINE BESYLATE 10 MG: 10 | 30 days supply | Qty: 30 | Fill #3

## 2019-08-09 MED FILL — ROSUVASTATIN CALCIUM 10 MG: 10 | 30 days supply | Qty: 30 | Fill #5

## 2019-08-09 NOTE — Progress Notes (Signed)
Assessment & Plan:  Samantha Barr was seen today for follow-up.  Diagnoses and all orders for this visit:  Essential hypertension -     CBC -     Lipid panel -     amLODipine (NORVASC) 10 MG tablet; Take 1 tablet (10 mg total) by mouth daily. -     losartan (COZAAR) 100 MG tablet; Take 1 tablet (100 mg total) by mouth daily. Continue all antihypertensives as prescribed.  Remember to bring in your blood pressure log with you for your follow up appointment.  DASH/Mediterranean Diets are healthier choices for HTN.    Stage 3b chronic kidney disease -     CMP14+EGFR  Right foot pain -     diclofenac sodium (VOLTAREN) 1 % GEL; Apply 2 g topically 4 (four) times daily.  Mixed hyperlipidemia INSTRUCTIONS: Work on a low fat, heart healthy diet and participate in regular aerobic exercise program by working out at least 150 minutes per week; 5 days a week-30 minutes per day. Avoid red meat, fried foods. junk foods, sodas, sugary drinks, unhealthy snacking, alcohol and smoking.  Drink at least 48oz of water per day and monitor your carbohydrate intake daily.    Patient has been counseled on age-appropriate routine health concerns for screening and prevention. These are reviewed and up-to-date. Referrals have been placed accordingly. Immunizations are up-to-date or declined.    Subjective:   Chief Complaint  Patient presents with  . Follow-up    Pt. is here to follow up HTN.    HPI Samantha Barr 38 y.o. female presents to office today for follow up to HTN and Dyslipidemia. States she has been having intermittent short episodes of light headedness with buzzing sound in ears. Lasting for a few seconds and goes away.    Essential Hypertension Well controlled. Taking amlodipine 10 mg and losartan 100 mg daily. She does not monitor her blood pressure at home. Denies chest pain, shortness of breath, palpitations, lightheadedness, dizziness, headaches or BLE edema.  BP Readings from Last 3  Encounters:  08/09/19 121/84  09/17/18 112/78  06/13/18 128/73     Hyperlipidemia Patient presents for follow up to poorly controlled hyperlipidemia.  She endorses medication compliance taking crestor 10 mg daily. She is not diet compliant and denies or statin intolerance including myalgias.  Lab Results  Component Value Date   CHOL 172 08/09/2019   Lab Results  Component Value Date   HDL 33 (L) 08/09/2019   Lab Results  Component Value Date   LDLCALC 100 (H) 08/09/2019   Lab Results  Component Value Date   TRIG 228 (H) 08/09/2019   Lab Results  Component Value Date   CHOLHDL 5.2 (H) 08/09/2019    Foot Pain Onset 2 months ago. Locations: lateral right foot between 4th and 5th toes down to lower lateral mid foot.  Aggravating factors: none. Denies any injury or trauma.   Review of Systems  Constitutional: Negative for fever, malaise/fatigue and weight loss.  HENT: Positive for tinnitus. Negative for nosebleeds.   Eyes: Negative.  Negative for blurred vision, double vision and photophobia.  Respiratory: Negative.  Negative for cough and shortness of breath.   Cardiovascular: Negative.  Negative for chest pain, palpitations and leg swelling.  Gastrointestinal: Negative.  Negative for heartburn, nausea and vomiting.  Musculoskeletal: Positive for joint pain. Negative for myalgias.  Neurological: Negative.  Negative for dizziness, focal weakness, seizures and headaches.       Lightheadedness  Psychiatric/Behavioral: Negative.  Negative for suicidal  ideas.    Past Medical History:  Diagnosis Date  . Hypertension   . Renal disorder     History reviewed. No pertinent surgical history.  Family History  Problem Relation Age of Onset  . Hypertension Maternal Aunt     Social History Reviewed with no changes to be made today.   Outpatient Medications Prior to Visit  Medication Sig Dispense Refill  . Misc. Devices MISC Please provide patient with an insurance approved  right wrist/hand splint. 1 each 0  . rosuvastatin (CRESTOR) 10 MG tablet Take 1 tablet (10 mg total) by mouth daily. 90 tablet 3  . fluticasone (FLONASE) 50 MCG/ACT nasal spray Place 1 spray into both nostrils daily. 16 g 2  . amLODipine (NORVASC) 10 MG tablet Take 1 tablet (10 mg total) by mouth daily. 90 tablet 1  . losartan (COZAAR) 100 MG tablet Take 1 tablet (100 mg total) by mouth daily. 90 tablet 1   No facility-administered medications prior to visit.     No Known Allergies     Objective:    BP 121/84 (BP Location: Left Arm, Patient Position: Sitting, Cuff Size: Normal)   Pulse 96   Temp 98.4 F (36.9 C) (Oral)   Ht _0  (1.651 m)   Wt 156 lb (70.8 kg)   LMP 07/15/2019   SpO2 99%   BMI 25.96 kg/m  Wt Readings from Last 3 Encounters:  08/09/19 156 lb (70.8 kg)  09/17/18 157 lb (71.2 kg)  06/13/18 155 lb 6.4 oz (70.5 kg)    Physical Exam Vitals signs and nursing note reviewed.  Constitutional:      Appearance: She is well-developed.  HENT:     Head: Normocephalic and atraumatic.  Neck:     Musculoskeletal: Normal range of motion.  Cardiovascular:     Rate and Rhythm: Normal rate and regular rhythm.     Pulses:          Dorsalis pedis pulses are 2+ on the right side.       Posterior tibial pulses are 2+ on the right side.     Heart sounds: Normal heart sounds. No murmur. No friction rub. No gallop.   Pulmonary:     Effort: Pulmonary effort is normal. No tachypnea or respiratory distress.     Breath sounds: Normal breath sounds. No decreased breath sounds, wheezing, rhonchi or rales.  Chest:     Chest wall: No tenderness.  Abdominal:     General: Bowel sounds are normal.     Palpations: Abdomen is soft.  Musculoskeletal: Normal range of motion.     Right foot: Normal range of motion. No deformity.       Feet:  Feet:     Right foot:     Skin integrity: Skin integrity normal.  Skin:    General: Skin is warm and dry.  Neurological:     Mental Status:  She is alert and oriented to person, place, and time.     Coordination: Coordination normal.  Psychiatric:        Behavior: Behavior normal. Behavior is cooperative.        Thought Content: Thought content normal.        Judgment: Judgment normal.        Patient has been counseled extensively about nutrition and exercise as well as the importance of adherence with medications and regular follow-up. The patient was given clear instructions to go to ER or return to medical center if symptoms don't improve,  worsen or new problems develop. The patient verbalized understanding.   Follow-up: Return in about 5 weeks (around 09/13/2019) for right foot pain and lightheadedness.   Gildardo Pounds, FNP-BC [facility] and [place], [place]   08/11/2019, 10:52 AM

## 2019-08-09 NOTE — Patient Instructions (Signed)
Benign Positional Vertigo Vertigo is the feeling that you or your surroundings are moving when they are not. Benign positional vertigo is the most common form of vertigo. This is usually a harmless condition (benign). This condition is positional. This means that symptoms are triggered by certain movements and positions. This condition can be dangerous if it occurs while you are doing something that could cause harm to you or others. This includes activities such as driving or operating machinery. What are the causes? In many cases, the cause of this condition is not known. It may be caused by a disturbance in an area of the inner ear that helps your brain to sense movement and balance. This disturbance can be caused by:  Viral infection (labyrinthitis).  Head injury.  Repetitive motion, such as jumping, dancing, or running. What increases the risk? You are more likely to develop this condition if:  You are a woman.  You are 50 years of age or older. What are the signs or symptoms? Symptoms of this condition usually happen when you move your head or your eyes in different directions. Symptoms may start suddenly, and usually last for less than a minute. They include:  Loss of balance and falling.  Feeling like you are spinning or moving.  Feeling like your surroundings are spinning or moving.  Nausea and vomiting.  Blurred vision.  Dizziness.  Involuntary eye movement (nystagmus). Symptoms can be mild and cause only minor problems, or they can be severe and interfere with daily life. Episodes of benign positional vertigo may return (recur) over time. Symptoms may improve over time. How is this diagnosed? This condition may be diagnosed based on:  Your medical history.  Physical exam of the head, neck, and ears.  Tests, such as: ? MRI. ? CT scan. ? Eye movement tests. Your health care provider may ask you to change positions quickly while he or she watches you for symptoms  of benign positional vertigo, such as nystagmus. Eye movement may be tested with a variety of exams that are designed to evaluate or stimulate vertigo. ? An electroencephalogram (EEG). This records electrical activity in your brain. ? Hearing tests. You may be referred to a health care provider who specializes in ear, nose, and throat (ENT) problems (otolaryngologist) or a provider who specializes in disorders of the nervous system (neurologist). How is this treated?  This condition may be treated in a session in which your health care provider moves your head in specific positions to adjust your inner ear back to normal. Treatment for this condition may take several sessions. Surgery may be needed in severe cases, but this is rare. In some cases, benign positional vertigo may resolve on its own in 2-4 weeks. Follow these instructions at home: Safety  Move slowly. Avoid sudden body or head movements or certain positions, as told by your health care provider.  Avoid driving until your health care provider says it is safe for you to do so.  Avoid operating heavy machinery until your health care provider says it is safe for you to do so.  Avoid doing any tasks that would be dangerous to you or others if vertigo occurs.  If you have trouble walking or keeping your balance, try using a cane for stability. If you feel dizzy or unstable, sit down right away.  Return to your normal activities as told by your health care provider. Ask your health care provider what activities are safe for you. General instructions  Take over-the-counter   and prescription medicines only as told by your health care provider.  Drink enough fluid to keep your urine pale yellow.  Keep all follow-up visits as told by your health care provider. This is important. Contact a health care provider if:  You have a fever.  Your condition gets worse or you develop new symptoms.  Your family or friends notice any  behavioral changes.  You have nausea or vomiting that gets worse.  You have numbness or a "pins and needles" sensation. Get help right away if you:  Have difficulty speaking or moving.  Are always dizzy.  Faint.  Develop severe headaches.  Have weakness in your legs or arms.  Have changes in your hearing or vision.  Develop a stiff neck.  Develop sensitivity to light. Summary  Vertigo is the feeling that you or your surroundings are moving when they are not. Benign positional vertigo is the most common form of vertigo.  The cause of this condition is not known. It may be caused by a disturbance in an area of the inner ear that helps your brain to sense movement and balance.  Symptoms include loss of balance and falling, feeling that you or your surroundings are moving, nausea and vomiting, and blurred vision.  This condition can be diagnosed based on symptoms, physical exam, and other tests, such as MRI, CT scan, eye movement tests, and hearing tests.  Follow safety instructions as told by your health care provider. You will also be told when to contact your health care provider in case of problems. This information is not intended to replace advice given to you by your health care provider. Make sure you discuss any questions you have with your health care provider. Document Released: 07/25/2006 Document Revised: 03/28/2018 Document Reviewed: 03/28/2018 Elsevier Patient Education  [place].  Dizziness Dizziness is a common problem. It makes you feel unsteady or light-headed. You may feel like you are about to pass out (faint). Dizziness can lead to getting hurt if you stumble or fall. Dizziness can be caused by many things, including:  Medicines.  Not having enough water in your body (dehydration).  Illness. Follow these instructions at home: Eating and drinking   Drink enough fluid to keep your pee (urine) clear or pale yellow. This helps to keep you from  getting dehydrated. Try to drink more clear fluids, such as water.  Do not drink alcohol.  Limit how much caffeine you drink or eat, if your doctor tells you to do that.  Limit how much salt (sodium) you drink or eat, if your doctor tells you to do that. Activity   Avoid making quick movements. ? When you stand up from sitting in a chair, steady yourself until you feel okay. ? In the morning, first sit up on the side of the bed. When you feel okay, stand slowly while you hold onto something. Do this until you know that your balance is fine.  If you need to stand in one place for a long time, move your legs often. Tighten and relax the muscles in your legs while you are standing.  Do not drive or use heavy machinery if you feel dizzy.  Avoid bending down if you feel dizzy. Place items in your home so you can reach them easily without leaning over. Lifestyle  Do not use any products that contain nicotine or tobacco, such as cigarettes and e-cigarettes. If you need help quitting, ask your doctor.  Try to lower your stress  level. You can do this by using methods such as yoga or meditation. Talk with your doctor if you need help. General instructions  Watch your dizziness for any changes.  Take over-the-counter and prescription medicines only as told by your doctor. Talk with your doctor if you think that you are dizzy because of a medicine that you are taking.  Tell a friend or a family member that you are feeling dizzy. If he or she notices any changes in your behavior, have this person call your doctor.  Keep all follow-up visits as told by your doctor. This is important. Contact a doctor if:  Your dizziness does not go away.  Your dizziness or light-headedness gets worse.  You feel sick to your stomach (nauseous).  You have trouble hearing.  You have new symptoms.  You are unsteady on your feet.  You feel like the room is spinning. Get help right away if:  You throw  up (vomit) or have watery poop (diarrhea), and you cannot eat or drink anything.  You have trouble: ? Talking. ? Walking. ? Swallowing. ? Using your arms, hands, or legs.  You feel generally weak.  You are not thinking clearly, or you have trouble forming sentences. A friend or family member may notice this.  You have: ? Chest pain. ? Pain in your belly (abdomen). ? Shortness of breath. ? Sweating.  Your vision changes.  You are bleeding.  You have a very bad headache.  You have neck pain or a stiff neck.  You have a fever. These symptoms may be an emergency. Do not wait to see if the symptoms will go away. Get medical help right away. Call your local emergency services (911 in the [place] Do not drive yourself to the hospital. Summary  Dizziness makes you feel unsteady or light-headed. You may feel like you are about to pass out (faint).  Drink enough fluid to keep your pee (urine) clear or pale yellow. Do not drink alcohol.  Avoid making quick movements if you feel dizzy.  Watch your dizziness for any changes. This information is not intended to replace advice given to you by your health care provider. Make sure you discuss any questions you have with your health care provider. Document Released: 10/06/2011 Document Revised: 10/20/2017 Document Reviewed: 11/03/2016 Elsevier Patient Education  2020 [employer].

## 2019-08-10 LAB — CMP14+EGFR
ALT: 8 IU/L (ref 0–32)
AST: 12 IU/L (ref 0–40)
Albumin/Globulin Ratio: 1.4 (ref 1.2–2.2)
Albumin: 4.1 g/dL (ref 3.8–4.8)
Alkaline Phosphatase: 63 IU/L (ref 39–117)
BUN/Creatinine Ratio: 16 (ref 9–23)
BUN: 29 mg/dL — ABNORMAL HIGH (ref 6–24)
Bilirubin Total: <0.2 mg/dL (ref 0.0–1.2)
CO2: 21 mmol/L (ref 20–29)
Calcium: 9 mg/dL (ref 8.7–10.2)
Chloride: 109 mmol/L — ABNORMAL HIGH (ref 96–106)
Creatinine, Ser: 1.83 mg/dL — ABNORMAL HIGH (ref 0.57–1.00)
GFR calc Af Amer: 39 mL/min/{1.73_m2} — ABNORMAL LOW (ref >59)
GFR calc non Af Amer: 34 mL/min/{1.73_m2} — ABNORMAL LOW (ref >59)
Globulin, Total: 2.9 g/dL (ref 1.5–4.5)
Glucose: 95 mg/dL (ref 65–99)
Potassium: 4.9 mmol/L (ref 3.5–5.2)
Sodium: 140 mmol/L (ref 134–144)
Total Protein: 7 g/dL (ref 6.0–8.5)

## 2019-08-10 LAB — CBC
Hematocrit: 33 % — ABNORMAL LOW (ref 34.0–46.6)
Hemoglobin: 10.5 g/dL — ABNORMAL LOW (ref 11.1–15.9)
MCH: 24.7 pg — ABNORMAL LOW (ref 26.6–33.0)
MCHC: 31.8 g/dL (ref 31.5–35.7)
MCV: 78 fL — ABNORMAL LOW (ref 79–97)
Platelets: 231 10*3/uL (ref 150–450)
RBC: 4.25 x10E6/uL (ref 3.77–5.28)
RDW: 16.5 % — ABNORMAL HIGH (ref 11.7–15.4)
WBC: 5.4 10*3/uL (ref 3.4–10.8)

## 2019-08-10 LAB — LIPID PANEL
Chol/HDL Ratio: 5.2 ratio — ABNORMAL HIGH (ref 0.0–4.4)
Cholesterol, Total: 172 mg/dL (ref 100–199)
HDL: 33 mg/dL — ABNORMAL LOW (ref >39)
LDL Chol Calc (NIH): 100 mg/dL — ABNORMAL HIGH (ref 0–99)
Triglycerides: 228 mg/dL — ABNORMAL HIGH (ref 0–149)
VLDL Cholesterol Cal: 39 mg/dL (ref 5–40)

## 2019-08-11 ENCOUNTER — Encounter: Payer: Self-pay | Admitting: Nurse Practitioner

## 2019-09-16 ENCOUNTER — Ambulatory Visit: Payer: Self-pay | Attending: Nurse Practitioner | Admitting: Nurse Practitioner

## 2019-09-16 ENCOUNTER — Other Ambulatory Visit: Payer: Self-pay

## 2019-09-16 ENCOUNTER — Encounter: Payer: Self-pay | Admitting: Nurse Practitioner

## 2019-09-16 DIAGNOSIS — G8929 Other chronic pain: Secondary | ICD-10-CM

## 2019-09-16 DIAGNOSIS — M79671 Pain in right foot: Secondary | ICD-10-CM

## 2019-09-16 DIAGNOSIS — K0889 Other specified disorders of teeth and supporting structures: Secondary | ICD-10-CM

## 2019-09-16 NOTE — Progress Notes (Signed)
Virtual Visit via Telephone Note Due to national recommendations of social distancing due to [place] 19, telehealth visit is felt to be most appropriate for this patient at this time.  I discussed the limitations, risks, security and privacy concerns of performing an evaluation and management service by telephone and the availability of in person appointments. I also discussed with the patient that there may be a patient responsible charge related to this service. The patient expressed understanding and agreed to proceed.    I connected with Samantha Barr on 09/16/19  at   4:10 PM EST  EDT by telephone and verified that I am speaking with the correct person using two identifiers.   Consent I discussed the limitations, risks, security and privacy concerns of performing an evaluation and management service by telephone and the availability of in person appointments. I also discussed with the patient that there may be a patient responsible charge related to this service. The patient expressed understanding and agreed to proceed.   Location of Patient: Private Residence   Location of Provider: [place] participating in Telemedicine visit: Geryl Rankins FNP-BC [place]    History of Present Illness: Telemedicine visit for: F/U dizziness and right foot pain  Dizziness She reportedly was experiencing lightheadedness and bilateral tinnitus at her last office visit on 08-09-2019. We discussed BPV and ways to lessen the symptoms. Today she reports almost complete resolution of symptoms.    Right Foot Pain Onset 3 months ago. Sharp shooting pain between the 4th and 5th toes down to the lower lateral mid foot. She denies any injury or trauma. There are no aggravating factors. Pain is intermittent and occurs regardless of any types of shoes that she wears. She denies any swelling, redness and difficulty weight bearing.         Past Medical History:  Diagnosis Date  . Hypertension   . Renal disorder     History reviewed. No pertinent surgical history.  Family History  Problem Relation Age of Onset  . Hypertension Maternal Aunt     Social History   Socioeconomic History  . Marital status: Married    Spouse name: Not on file  . Number of children: Not on file  . Years of education: Not on file  . Highest education level: Not on file  Occupational History  . Not on file  Social Needs  . Financial resource strain: Not on file  . Food insecurity    Worry: Not on file    Inability: Not on file  . Transportation needs    Medical: Not on file    Non-medical: Not on file  Tobacco Use  . Smoking status: Never Smoker  . Smokeless tobacco: Never Used  Substance and Sexual Activity  . Alcohol use: No  . Drug use: No  . Sexual activity: Not on file  Lifestyle  . Physical activity    Days per week: Not on file    Minutes per session: Not on file  . Stress: Not on file  Relationships  . Social Herbalist on phone: Not on file    Gets together: Not on file    Attends religious service: Not on file    Active member of club or organization: Not on file    Attends meetings of clubs or organizations: Not on file    Relationship status: Not on file  Other Topics Concern  . Not on  file  Social History Narrative  . Not on file     Observations/Objective: Awake, alert and oriented x 3   Review of Systems  Constitutional: Negative for fever, malaise/fatigue and weight loss.       Left top molar pain  HENT: Negative.  Negative for nosebleeds.   Eyes: Negative.  Negative for blurred vision, double vision and photophobia.  Respiratory: Negative.  Negative for cough and shortness of breath.   Cardiovascular: Negative.  Negative for chest pain, palpitations and leg swelling.  Gastrointestinal: Negative.  Negative for heartburn, nausea and vomiting.  Musculoskeletal: Positive for  joint pain. Negative for myalgias.       SEE HPI  Neurological: Negative.  Negative for dizziness, focal weakness, seizures and headaches.  Psychiatric/Behavioral: Negative.  Negative for suicidal ideas.    Assessment and Plan: Samantha Barr was seen today for follow-up.  Diagnoses and all orders for this visit:  Chronic foot pain, right -     Ambulatory referral to Podiatry -     DG Foot Complete Right; Future  Pain, dental -     Ambulatory referral to Dentistry     Follow Up Instructions Return if symptoms worsen or fail to improve.     I discussed the assessment and treatment plan with the patient. The patient was provided an opportunity to ask questions and all were answered. The patient agreed with the plan and demonstrated an understanding of the instructions.   The patient was advised to call back or seek an in-person evaluation if the symptoms worsen or if the condition fails to improve as anticipated.  I provided 17 minutes of non-face-to-face time during this encounter including median intraservice time, reviewing previous notes, labs, imaging, medications and explaining diagnosis and management.  Gildardo Pounds, FNP-BC

## 2019-09-23 ENCOUNTER — Ambulatory Visit (HOSPITAL_COMMUNITY)
Admission: RE | Admit: 2019-09-23 | Discharge: 2019-09-23 | Disposition: A | Discharge status: Home / Self Care | Payer: Self-pay | Source: Ambulatory Visit | Attending: Nurse Practitioner | Admitting: Nurse Practitioner

## 2019-09-23 ENCOUNTER — Other Ambulatory Visit: Payer: Self-pay

## 2019-09-23 DIAGNOSIS — M79671 Pain in right foot: Secondary | ICD-10-CM | POA: Insufficient documentation

## 2019-09-23 DIAGNOSIS — G8929 Other chronic pain: Secondary | ICD-10-CM | POA: Insufficient documentation

## 2019-09-24 ENCOUNTER — Other Ambulatory Visit: Payer: Self-pay

## 2019-09-24 ENCOUNTER — Ambulatory Visit: Payer: No Typology Code available for payment source | Admitting: Podiatry

## 2019-09-24 ENCOUNTER — Other Ambulatory Visit: Payer: Self-pay | Admitting: Nurse Practitioner

## 2019-09-24 ENCOUNTER — Encounter: Payer: Self-pay | Admitting: Podiatry

## 2019-09-24 VITALS — BP 93/56 | HR 88 | Resp 16

## 2019-09-24 DIAGNOSIS — G5761 Lesion of plantar nerve, right lower limb: Secondary | ICD-10-CM

## 2019-09-24 DIAGNOSIS — M7731 Calcaneal spur, right foot: Secondary | ICD-10-CM

## 2019-09-24 DIAGNOSIS — G5781 Other specified mononeuropathies of right lower limb: Secondary | ICD-10-CM

## 2019-09-24 NOTE — Progress Notes (Signed)
  Subjective:  Patient ID: Samantha Barr, female    DOB: 11-02-1980,  MRN: [id number] HPI Chief Complaint  Patient presents with  . Foot Pain    Dorsal/lateral and 4th and 5th toes right - aching and radiating pain x few months, no injury, PCP Rx'd gel for pain and xrayed yesterday  . New Patient (Initial Visit)    38 y.o. female presents with the above complaint.   ROS: Denies fever chills nausea vomiting muscle aches pains calf pain back pain chest pain shortness of breath.  Past Medical History:  Diagnosis Date  . Hypertension   . Renal disorder    No past surgical history on file.  Current Outpatient Medications:  .  amLODipine (NORVASC) 10 MG tablet, Take 1 tablet (10 mg total) by mouth daily., Disp: 90 tablet, Rfl: 0 .  fluticasone (FLONASE) 50 MCG/ACT nasal spray, Place 1 spray into both nostrils daily., Disp: 16 g, Rfl: 2 .  losartan (COZAAR) 100 MG tablet, Take 1 tablet (100 mg total) by mouth daily., Disp: 90 tablet, Rfl: 0 .  Misc. Devices MISC, Please provide patient with an insurance approved right wrist/hand splint., Disp: 1 each, Rfl: 0 .  rosuvastatin (CRESTOR) 10 MG tablet, Take 1 tablet (10 mg total) by mouth daily., Disp: 90 tablet, Rfl: 3  No Known Allergies Review of Systems Objective:   Vitals:   09/24/19 0932  BP: (!) 93/56  Pulse: 88  Resp: 16    General: Well developed, nourished, in no acute distress, alert and oriented x3   Dermatological: Skin is warm, dry and supple bilateral. Nails x 10 are well maintained; remaining integument appears unremarkable at this time. There are no open sores, no preulcerative lesions, no rash or signs of infection present.  Vascular: Dorsalis Pedis artery and Posterior Tibial artery pedal pulses are 2/4 bilateral with immedate capillary fill time. Pedal hair growth present. No varicosities and no lower extremity edema present bilateral.   Neruologic: Grossly intact via light touch bilateral. Vibratory intact via  tuning fork bilateral. Protective threshold with Semmes Wienstein monofilament intact to all pedal sites bilateral. Patellar and Achilles deep tendon reflexes 2+ bilateral. No Babinski or clonus noted bilateral.  She has a palpable Mulder's click third interdigital space of the right foot.  Musculoskeletal: No gross boney pedal deformities bilateral. No pain, crepitus, or limitation noted with foot and ankle range of motion bilateral. Muscular strength 5/5 in all groups tested bilateral.  Gait: Unassisted, Nonantalgic.    Radiographs:  Radiographs taken today do not demonstrate any type of osseous abnormalities of the right foot.  Assessment & Plan:   Assessment: Neuroma third interspace right.  Plan: After sterile Betadine skin prep I injected 10 mg Kenalog 5 mg Marcaine point of maximal tenderness of the right third interdigital space.  She tolerated procedure well without complications follow-up with her as needed.     Maecie Sevcik T. [place], [place]

## 2019-09-25 ENCOUNTER — Encounter: Payer: Self-pay | Admitting: Podiatry

## 2019-09-25 NOTE — Telephone Encounter (Signed)
I called pt's home phone, and the husband answered stating he would have pt call.

## 2019-09-25 NOTE — Telephone Encounter (Signed)
[phone] - Pt's husband called on this number and I explained my message to her in [place] and to go to the ED if problems. Pt's husband requested the [phone] be removed. I told pt's husband I had sent the message concerning the x-ray to Dr. Milinda Pointer who was not in the office.

## 2019-10-17 ENCOUNTER — Other Ambulatory Visit: Payer: Self-pay | Admitting: Nurse Practitioner

## 2019-10-17 MED FILL — LOSARTAN POTASSIUM 100 MG T: 100 | 30 days supply | Qty: 30 | Fill #5

## 2019-10-17 MED FILL — AMLODIPINE BESYLATE 10 MG T: 10 | 30 days supply | Qty: 30 | Fill #4

## 2019-10-17 NOTE — Telephone Encounter (Signed)
Last Lipid 08/09/2019 but no refill since earlier in the year. Please refill if the appropriate medication for concern.

## 2019-10-21 MED FILL — ROSUVASTATIN CALCIUM 10 MG: 10 | 30 days supply | Qty: 30 | Fill #0

## 2019-11-04 MED FILL — ROSUVASTATIN CALCIUM 10 MG: 10 | 30 days supply | Qty: 30 | Fill #0

## 2019-11-26 ENCOUNTER — Other Ambulatory Visit: Payer: Self-pay

## 2019-11-26 ENCOUNTER — Ambulatory Visit: Payer: No Typology Code available for payment source | Admitting: Podiatry

## 2019-11-26 DIAGNOSIS — G5761 Lesion of plantar nerve, right lower limb: Secondary | ICD-10-CM

## 2019-11-26 DIAGNOSIS — G5781 Other specified mononeuropathies of right lower limb: Secondary | ICD-10-CM

## 2019-11-26 MED ORDER — METHYLPREDNISOLONE 4 MG PO TBPK: ORAL_TABLET | ORAL | 0 refills | Status: DC

## 2019-11-26 MED FILL — ?METHYLPREDNISOLONE 4 MG TA: 4 | 6 days supply | Qty: 21 | Fill #0

## 2019-11-26 NOTE — Progress Notes (Signed)
She presents today for follow-up of her neuroma she states that the neuroma is doing much better but she states when she is been driving the car she started experiencing radiating pain up her leg from the top of her foot.  States that it seems to be improving over the past few weeks but she just wanted to come in let me check her foot out.  Objective: Vital signs are stable alert oriented x3 no reproducible pain on palpation third interspace of the right foot.  Assessment: Neuritis dorsal aspect of the right foot leg.  With resolving neuroma.  Plan: Discussed etiology pathology conservative therapies at this point time went ahead and started her on methylprednisolone.  This should help alleviate her symptoms.  I will follow-up with her in about a month or so if this fails to alleviate her symptoms we may reinject or send for nerve conduction velocity exam.

## 2019-12-31 MED FILL — ROSUVASTATIN CALCIUM 10 MG: 10 | 30 days supply | Qty: 30 | Fill #1

## 2019-12-31 MED FILL — AMLODIPINE BESYLATE 10 MG T: 10 | 30 days supply | Qty: 30 | Fill #5

## 2019-12-31 MED FILL — LOSARTAN POTASSIUM 100 MG T: 100 | 30 days supply | Qty: 30 | Fill #6

## 2020-01-09 ENCOUNTER — Ambulatory Visit: Payer: No Typology Code available for payment source | Admitting: Podiatry

## 2020-01-09 ENCOUNTER — Other Ambulatory Visit: Payer: Self-pay

## 2020-01-09 ENCOUNTER — Encounter: Payer: Self-pay | Admitting: Podiatry

## 2020-01-09 VITALS — Temp 97.8°F

## 2020-01-09 DIAGNOSIS — G5791 Unspecified mononeuropathy of right lower limb: Secondary | ICD-10-CM

## 2020-01-09 DIAGNOSIS — G5761 Lesion of plantar nerve, right lower limb: Secondary | ICD-10-CM

## 2020-01-09 NOTE — Progress Notes (Signed)
She presents today for follow-up of her neuritis of her right foot.  The dorsal aspect of the foot feels better as does the neuroma to the third interdigital space.  She is now having pain here she points to the fourth interdigital space of the right foot.  Objective: Vital signs are stable alert and oriented x3.  There is no erythema edema cellulitis drainage odor she has palpable Mulder's click for third interspace of the right foot.  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  Plan: Start her first dose of dehydrated alcohol follow-up with her in 3 to 4 weeks.

## 2020-01-27 ENCOUNTER — Ambulatory Visit: Payer: Self-pay | Attending: Internal Medicine

## 2020-01-27 DIAGNOSIS — Z23 Encounter for immunization: Secondary | ICD-10-CM

## 2020-01-27 NOTE — Progress Notes (Signed)
   Covid-19 Vaccination Clinic  Name:  Samantha Barr    MRN: [id number] DOB: August 31, 1981  01/27/2020  [name] was observed post Covid-19 immunization for 15 minutes without incident. She was provided with Vaccine Information Sheet and instruction to access the V-Safe system.   [name] was instructed to call 911 with any severe reactions post vaccine: Marland Kitchen Difficulty breathing  . Swelling of face and throat  . A fast heartbeat  . A bad rash all over body  . Dizziness and weakness   Immunizations Administered    Name Date Dose VIS Date Route   JANSSEN COVID-19 VACCINE 01/27/2020 11:54 AM 0.5 mL 12/28/2019 Intramuscular   Manufacturer: [employer]   Lot: [id number]   [place]: [phone]

## 2020-01-28 ENCOUNTER — Other Ambulatory Visit: Payer: Self-pay

## 2020-01-28 ENCOUNTER — Ambulatory Visit: Payer: Self-pay | Attending: Nurse Practitioner | Admitting: Nurse Practitioner

## 2020-01-28 ENCOUNTER — Encounter: Payer: Self-pay | Admitting: Nurse Practitioner

## 2020-01-28 VITALS — BP 112/75 | HR 100 | Temp 97.7°F | Ht 65.0 in | Wt 152.0 lb

## 2020-01-28 DIAGNOSIS — G5791 Unspecified mononeuropathy of right lower limb: Secondary | ICD-10-CM

## 2020-01-28 DIAGNOSIS — N1832 Chronic kidney disease, stage 3b: Secondary | ICD-10-CM

## 2020-01-28 DIAGNOSIS — I10 Essential (primary) hypertension: Secondary | ICD-10-CM

## 2020-01-28 DIAGNOSIS — E782 Mixed hyperlipidemia: Secondary | ICD-10-CM

## 2020-01-28 MED ORDER — AMLODIPINE BESYLATE 5 MG PO TABS: 5.0000 mg | ORAL_TABLET | Freq: Every day | ORAL | 3 refills | Status: DC

## 2020-01-28 MED ORDER — GABAPENTIN 600 MG PO TABS: 300.0000 mg | ORAL_TABLET | Freq: Every day | ORAL | 2 refills | Status: DC

## 2020-01-28 MED FILL — AMLODIPINE BESYLATE 5 MG TA: 5 | 30 days supply | Qty: 30 | Fill #0

## 2020-01-28 MED FILL — GABAPENTIN 600 MG TABLET: 600 | 30 days supply | Qty: 15 | Fill #0

## 2020-01-28 NOTE — Progress Notes (Signed)
Assessment & Plan:  Samantha Barr was seen today for hip pain.  Diagnoses and all orders for this visit:  Neuritis of right foot -     gabapentin (NEURONTIN) 600 MG tablet; Take 0.5 tablets (300 mg total) by mouth at bedtime.  Essential hypertension -     amLODipine (NORVASC) 5 MG tablet; Take 1 tablet (5 mg total) by mouth daily. Continue all antihypertensives as prescribed.  Remember to bring in your blood pressure log with you for your follow up appointment.  DASH/Mediterranean Diets are healthier choices for HTN.   Stage 3b chronic kidney disease -     CMP14+EGFR -     CBC  Mixed hyperlipidemia -     Lipid panel INSTRUCTIONS: Work on a low fat, heart healthy diet and participate in regular aerobic exercise program by working out at least 150 minutes per week; 5 days a week-30 minutes per day. Avoid red meat/beef/steak,  fried foods. junk foods, sodas, sugary drinks, unhealthy snacking, alcohol and smoking.  Drink at least 80 oz of water per day and monitor your carbohydrate intake daily.     Patient has been counseled on age-appropriate routine health concerns for screening and prevention. These are reviewed and up-to-date. Referrals have been placed accordingly. Immunizations are up-to-date or declined.    Subjective:   Chief Complaint  Patient presents with  . Hip Pain    Pt. stated her when her hip start hurting the pain radiates down to her right leg.    HPI   Very tearful today. Husband passed from [place] in January. She declines SSRI at this time.    Samantha Barr 39 y.o. female presents to office today with concerns of back, right hip and right leg pain. Back pain is controlled. States she has had low back pain for years and takes tylenol or avoids activities for relief. Aggravating factors: Standing for long periods of time. She does endorse that she was instructed to visit with me regarding her back pain however she states her back pain is not new and is relieved  with tylenol however her foot hurts even when her back does not hurt.  She has no back or foot pain today.   Her main concern is the pain she continues to experience in her right foot. She has had a few injections administered by Podiatry but does not really feel these have been too helpful in relieving her pain for any reasonable length of time.  States they are helpful but she was expecting her symptoms to be alleviated for a longer length of time.  She has been diagnosed with right foot neuritis and neuroma of the third interspace of the right foot.  Plan per Dr. Milinda Barr with podiatry is to start her first dose of dehydrated alcohol.   CKD Stage 3 Seeing nephrology.      Essential Hypertension Decreased amlodipine to 51m daily today. Blood pressure well controlled however with episodes of hypotension. Denies chest pain, shortness of breath, palpitations, lightheadedness, dizziness, headaches or BLE edema.  BP Readings from Last 3 Encounters:  01/28/20 112/75  09/24/19 (!) 93/56  08/09/19 121/84   Review of Systems  Constitutional: Negative for fever, malaise/fatigue and weight loss.  HENT: Negative.  Negative for nosebleeds.   Eyes: Negative.  Negative for blurred vision, double vision and photophobia.  Respiratory: Negative.  Negative for cough and shortness of breath.   Cardiovascular: Negative.  Negative for chest pain, palpitations and leg swelling.  Gastrointestinal: Negative.  Negative  for heartburn, nausea and vomiting.  Musculoskeletal: Positive for back pain and myalgias.  Neurological: Positive for sensory change. Negative for dizziness, focal weakness, seizures and headaches.  Psychiatric/Behavioral: Negative.  Negative for suicidal ideas.    Past Medical History:  Diagnosis Date  . Hypertension   . Renal disorder     History reviewed. No pertinent surgical history.  Family History  Problem Relation Age of Onset  . Hypertension Maternal Aunt     Social History  Reviewed with no changes to be made today.   Outpatient Medications Prior to Visit  Medication Sig Dispense Refill  . Misc. Devices MISC Please provide patient with an insurance approved right wrist/hand splint. 1 each 0  . rosuvastatin (CRESTOR) 10 MG tablet TAKE 1 TABLET (10 MG TOTAL) BY MOUTH DAILY. 30 tablet 3  . fluticasone (FLONASE) 50 MCG/ACT nasal spray Place 1 spray into both nostrils daily. 16 g 2  . losartan (COZAAR) 100 MG tablet Take 1 tablet (100 mg total) by mouth daily. 90 tablet 0  . amLODipine (NORVASC) 10 MG tablet Take 1 tablet (10 mg total) by mouth daily. 90 tablet 0   No facility-administered medications prior to visit.    No Known Allergies     Objective:    BP 112/75 (BP Location: Left Arm, Patient Position: Sitting, Cuff Size: Normal)   Pulse 100   Temp 97.7 F (36.5 C) (Temporal)   Ht 5' 5"  (1.651 m)   Wt 152 lb (68.9 kg)   SpO2 100%   BMI 25.29 kg/m  Wt Readings from Last 3 Encounters:  01/28/20 152 lb (68.9 kg)  08/09/19 156 lb (70.8 kg)  09/17/18 157 lb (71.2 kg)    Physical Exam Vitals and nursing note reviewed.  Constitutional:      Appearance: She is well-developed.  HENT:     Head: Normocephalic and atraumatic.  Cardiovascular:     Rate and Rhythm: Normal rate and regular rhythm.     Heart sounds: Normal heart sounds. No murmur. No friction rub. No gallop.   Pulmonary:     Effort: Pulmonary effort is normal. No tachypnea or respiratory distress.     Breath sounds: Normal breath sounds. No decreased breath sounds, wheezing, rhonchi or rales.  Chest:     Chest wall: No tenderness.  Abdominal:     General: Bowel sounds are normal.     Palpations: Abdomen is soft.  Musculoskeletal:        General: Normal range of motion.     Cervical back: Normal range of motion.  Skin:    General: Skin is warm and dry.  Neurological:     Mental Status: She is alert and oriented to person, place, and time.     Coordination: Coordination normal.    Psychiatric:        Behavior: Behavior normal. Behavior is cooperative.        Thought Content: Thought content normal.        Judgment: Judgment normal.          Patient has been counseled extensively about nutrition and exercise as well as the importance of adherence with medications and regular follow-up. The patient was given clear instructions to go to ER or return to medical center if symptoms don't improve, worsen or new problems develop. The patient verbalized understanding.   Follow-up: Return in about 3 weeks (around 02/18/2020).   Gildardo Pounds, FNP-BC [facility] and [facility] [place], [place]   01/28/2020,  3:12 PM

## 2020-01-29 LAB — CBC
Hematocrit: 37.6 % (ref 34.0–46.6)
Hemoglobin: 11.8 g/dL (ref 11.1–15.9)
MCH: 25.9 pg — ABNORMAL LOW (ref 26.6–33.0)
MCHC: 31.4 g/dL — ABNORMAL LOW (ref 31.5–35.7)
MCV: 83 fL (ref 79–97)
Platelets: 223 10*3/uL (ref 150–450)
RBC: 4.55 x10E6/uL (ref 3.77–5.28)
RDW: 16.1 % — ABNORMAL HIGH (ref 11.7–15.4)
WBC: 5.2 10*3/uL (ref 3.4–10.8)

## 2020-01-29 LAB — CMP14+EGFR
ALT: 7 IU/L (ref 0–32)
AST: 14 IU/L (ref 0–40)
Albumin/Globulin Ratio: 1.6 (ref 1.2–2.2)
Albumin: 4.4 g/dL (ref 3.8–4.8)
Alkaline Phosphatase: 60 IU/L (ref 39–117)
BUN/Creatinine Ratio: 14 (ref 9–23)
BUN: 25 mg/dL — ABNORMAL HIGH (ref 6–24)
Bilirubin Total: 0.2 mg/dL (ref 0.0–1.2)
CO2: 18 mmol/L — ABNORMAL LOW (ref 20–29)
Calcium: 9 mg/dL (ref 8.7–10.2)
Chloride: 108 mmol/L — ABNORMAL HIGH (ref 96–106)
Creatinine, Ser: 1.76 mg/dL — ABNORMAL HIGH (ref 0.57–1.00)
GFR calc Af Amer: 41 mL/min/{1.73_m2} — ABNORMAL LOW (ref >59)
GFR calc non Af Amer: 35 mL/min/{1.73_m2} — ABNORMAL LOW (ref >59)
Globulin, Total: 2.8 g/dL (ref 1.5–4.5)
Glucose: 100 mg/dL — ABNORMAL HIGH (ref 65–99)
Potassium: 4.9 mmol/L (ref 3.5–5.2)
Sodium: 140 mmol/L (ref 134–144)
Total Protein: 7.2 g/dL (ref 6.0–8.5)

## 2020-01-29 LAB — LIPID PANEL
Chol/HDL Ratio: 5.1 ratio — ABNORMAL HIGH (ref 0.0–4.4)
Cholesterol, Total: 159 mg/dL (ref 100–199)
HDL: 31 mg/dL — ABNORMAL LOW (ref >39)
LDL Chol Calc (NIH): 84 mg/dL (ref 0–99)
Triglycerides: 261 mg/dL — ABNORMAL HIGH (ref 0–149)
VLDL Cholesterol Cal: 44 mg/dL — ABNORMAL HIGH (ref 5–40)

## 2020-02-13 MED FILL — LOSARTAN POTASSIUM 100 MG T: 100 | 30 days supply | Qty: 30 | Fill #0

## 2020-02-13 MED FILL — ROSUVASTATIN CALCIUM 10 MG: 10 | 30 days supply | Qty: 30 | Fill #2

## 2020-02-18 ENCOUNTER — Ambulatory Visit: Payer: Self-pay | Admitting: Nurse Practitioner

## 2020-03-04 ENCOUNTER — Other Ambulatory Visit: Payer: Self-pay | Admitting: Nurse Practitioner

## 2020-03-04 ENCOUNTER — Other Ambulatory Visit: Payer: Self-pay

## 2020-03-04 ENCOUNTER — Encounter: Payer: Self-pay | Admitting: Nurse Practitioner

## 2020-03-04 ENCOUNTER — Ambulatory Visit: Payer: Self-pay | Attending: Nurse Practitioner | Admitting: Nurse Practitioner

## 2020-03-04 VITALS — BP 115/75 | HR 94 | Temp 97.7°F | Ht 65.0 in | Wt 155.0 lb

## 2020-03-04 DIAGNOSIS — G5791 Unspecified mononeuropathy of right lower limb: Secondary | ICD-10-CM

## 2020-03-04 DIAGNOSIS — I10 Essential (primary) hypertension: Secondary | ICD-10-CM

## 2020-03-04 DIAGNOSIS — E782 Mixed hyperlipidemia: Secondary | ICD-10-CM

## 2020-03-04 MED ORDER — LOSARTAN POTASSIUM 100 MG PO TABS: 100.0000 mg | ORAL_TABLET | Freq: Every day | ORAL | 1 refills | Status: DC

## 2020-03-04 MED ORDER — ROSUVASTATIN CALCIUM 10 MG PO TABS: 10.0000 mg | ORAL_TABLET | Freq: Every day | ORAL | 3 refills | Status: DC

## 2020-03-04 MED ORDER — GABAPENTIN 300 MG PO CAPS: 300.0000 mg | ORAL_CAPSULE | Freq: Every day | ORAL | 0 refills | Status: DC

## 2020-03-04 MED ORDER — AMLODIPINE BESYLATE 5 MG PO TABS: 5.0000 mg | ORAL_TABLET | Freq: Every day | ORAL | 3 refills | Status: DC

## 2020-03-04 MED FILL — GABAPENTIN 300 MG CAPSULE: 300 | 90 days supply | Qty: 90 | Fill #0

## 2020-03-04 MED FILL — AMLODIPINE BESYLATE 5 MG TA: 5 | 90 days supply | Qty: 90 | Fill #0

## 2020-03-04 NOTE — Progress Notes (Signed)
Assessment & Plan:  Samantha Barr was seen today for follow-up.  Diagnoses and all orders for this visit:  Mixed hyperlipidemia -     rosuvastatin (CRESTOR) 10 MG tablet; Take 1 tablet (10 mg total) by mouth daily. Please fill as a 90 day supply  Essential hypertension -     losartan (COZAAR) 100 MG tablet; Take 1 tablet (100 mg total) by mouth daily. Please fill as a 90 day supply -     amLODipine (NORVASC) 5 MG tablet; Take 1 tablet (5 mg total) by mouth daily. Please fill as a 90 day supply  Neuritis of right foot -     gabapentin (NEURONTIN) 300 MG capsule; Take 1 capsule (300 mg total) by mouth at bedtime. Please fill as a 90 day supply    Patient has been counseled on age-appropriate routine health concerns for screening and prevention. These are reviewed and up-to-date. Referrals have been placed accordingly. Immunizations are up-to-date or declined.    Subjective:   Chief Complaint  Patient presents with   Follow-up    Pt. is here for blood pressure check.    HPI Samantha Barr 39 y.o. female presents to office today for follow up.   HTN Well controlled with losartan 100 mg daily and amlodipine 5 mg daily. She does not monitor her blood pressure at home. Denies chest pain, shortness of breath, palpitations, lightheadedness, dizziness, headaches or BLE edema.  BP Readings from Last 3 Encounters:  03/04/20 115/75  01/28/20 112/75  09/24/19 (!) 93/56   Right Foot Neuritis Being followed by podiatry. I prescribed her gabapentin 300 mg nightly for her foot pain a little over a month ago. She does not take it every night however she does state it provides significant relief of her pain. She has had a few injections administered by Podiatry but does not really feel these have been too helpful in relieving her pain for any reasonable length of time.  States they are helpful but she was expecting her symptoms to be alleviated for a longer length of time.  She has been diagnosed  with right foot neuritis and neuroma of the third interspace of the right foot.  Plan per Dr. Milinda Pointer with podiatry is to start her first dose of dehydrated alcohol.       Review of Systems  Constitutional: Negative for fever, malaise/fatigue and weight loss.  HENT: Negative.  Negative for nosebleeds.   Eyes: Negative.  Negative for blurred vision, double vision and photophobia.  Respiratory: Negative.  Negative for cough and shortness of breath.   Cardiovascular: Negative.  Negative for chest pain, palpitations and leg swelling.  Gastrointestinal: Negative.  Negative for heartburn, nausea and vomiting.  Musculoskeletal: Negative.  Negative for myalgias.  Neurological: Positive for sensory change. Negative for dizziness, focal weakness, seizures and headaches.  Psychiatric/Behavioral: Negative.  Negative for suicidal ideas.    Past Medical History:  Diagnosis Date   Hypertension    Renal disorder     History reviewed. No pertinent surgical history.  Family History  Problem Relation Age of Onset   Hypertension Maternal Aunt     Social History Reviewed with no changes to be made today.   Outpatient Medications Prior to Visit  Medication Sig Dispense Refill   Misc. Devices MISC Please provide patient with an insurance approved right wrist/hand splint. 1 each 0   amLODipine (NORVASC) 5 MG tablet Take 1 tablet (5 mg total) by mouth daily. 90 tablet 3   rosuvastatin (CRESTOR) 10  MG tablet TAKE 1 TABLET (10 MG TOTAL) BY MOUTH DAILY. 30 tablet 3   fluticasone (FLONASE) 50 MCG/ACT nasal spray Place 1 spray into both nostrils daily. 16 g 2   gabapentin (NEURONTIN) 600 MG tablet Take 0.5 tablets (300 mg total) by mouth at bedtime. 15 tablet 2   losartan (COZAAR) 100 MG tablet Take 1 tablet (100 mg total) by mouth daily. 90 tablet 0   No facility-administered medications prior to visit.    No Known Allergies     Objective:    BP 115/75 (BP Location: Left Arm, Patient  Position: Sitting, Cuff Size: Normal)    Pulse 94    Temp 97.7 F (36.5 C) (Temporal)    Ht 5\' 5"  (1.651 m)    Wt 155 lb (70.3 kg)    SpO2 97%    BMI 25.79 kg/m  Wt Readings from Last 3 Encounters:  03/04/20 155 lb (70.3 kg)  01/28/20 152 lb (68.9 kg)  08/09/19 156 lb (70.8 kg)    Physical Exam Vitals and nursing note reviewed.  Constitutional:      Appearance: She is well-developed.  HENT:     Head: Normocephalic and atraumatic.  Cardiovascular:     Rate and Rhythm: Normal rate and regular rhythm.     Heart sounds: Normal heart sounds. No murmur. No friction rub. No gallop.   Pulmonary:     Effort: Pulmonary effort is normal. No tachypnea or respiratory distress.     Breath sounds: Normal breath sounds. No decreased breath sounds, wheezing, rhonchi or rales.  Chest:     Chest wall: No tenderness.  Abdominal:     General: Bowel sounds are normal.     Palpations: Abdomen is soft.  Musculoskeletal:        General: Normal range of motion.     Cervical back: Normal range of motion.  Skin:    General: Skin is warm and dry.  Neurological:     Mental Status: She is alert and oriented to person, place, and time.     Coordination: Coordination normal.  Psychiatric:        Behavior: Behavior normal. Behavior is cooperative.        Thought Content: Thought content normal.        Judgment: Judgment normal.          Patient has been counseled extensively about nutrition and exercise as well as the importance of adherence with medications and regular follow-up. The patient was given clear instructions to go to ER or return to medical center if symptoms don't improve, worsen or new problems develop. The patient verbalized understanding.   Follow-up: Return for F/U SEPTEMBER. She will call to schedule. Gildardo Pounds, FNP-BC [facility] and [facility] [place], [place]   03/04/2020, 2:51 PM

## 2020-03-10 ENCOUNTER — Ambulatory Visit: Payer: No Typology Code available for payment source | Admitting: Podiatry

## 2020-04-01 MED FILL — ROSUVASTATIN CALCIUM 10 MG: 10 | 90 days supply | Qty: 90 | Fill #0

## 2020-04-01 MED FILL — LOSARTAN POTASSIUM 100 MG T: 100 | 90 days supply | Qty: 90 | Fill #0

## 2020-07-01 ENCOUNTER — Emergency Department (HOSPITAL_BASED_OUTPATIENT_CLINIC_OR_DEPARTMENT_OTHER): Payer: No Typology Code available for payment source

## 2020-07-01 ENCOUNTER — Other Ambulatory Visit: Payer: Self-pay

## 2020-07-01 ENCOUNTER — Encounter (HOSPITAL_BASED_OUTPATIENT_CLINIC_OR_DEPARTMENT_OTHER): Payer: Self-pay | Admitting: *Deleted

## 2020-07-01 DIAGNOSIS — I10 Essential (primary) hypertension: Secondary | ICD-10-CM | POA: Insufficient documentation

## 2020-07-01 DIAGNOSIS — R0789 Other chest pain: Secondary | ICD-10-CM | POA: Insufficient documentation

## 2020-07-01 LAB — CBC WITH DIFFERENTIAL/PLATELET
Abs Immature Granulocytes: 0.03 10*3/uL (ref 0.00–0.07)
Basophils Absolute: 0 10*3/uL (ref 0.0–0.1)
Basophils Relative: 0 %
Eosinophils Absolute: 0.6 10*3/uL — ABNORMAL HIGH (ref 0.0–0.5)
Eosinophils Relative: 9 %
HCT: 33.9 % — ABNORMAL LOW (ref 36.0–46.0)
Hemoglobin: 10.5 g/dL — ABNORMAL LOW (ref 12.0–15.0)
Immature Granulocytes: 0 %
Lymphocytes Relative: 31 %
Lymphs Abs: 2.1 10*3/uL (ref 0.7–4.0)
MCH: 26 pg (ref 26.0–34.0)
MCHC: 31 g/dL (ref 30.0–36.0)
MCV: 83.9 fL (ref 80.0–100.0)
Monocytes Absolute: 0.4 10*3/uL (ref 0.1–1.0)
Monocytes Relative: 6 %
Neutro Abs: 3.6 10*3/uL (ref 1.7–7.7)
Neutrophils Relative %: 54 %
Platelets: 227 10*3/uL (ref 150–400)
RBC: 4.04 MIL/uL (ref 3.87–5.11)
RDW: 15.7 % — ABNORMAL HIGH (ref 11.5–15.5)
WBC: 6.8 10*3/uL (ref 4.0–10.5)
nRBC: 0 % (ref 0.0–0.2)

## 2020-07-01 LAB — COMPREHENSIVE METABOLIC PANEL
ALT: 13 U/L (ref 0–44)
AST: 16 U/L (ref 15–41)
Albumin: 3.6 g/dL (ref 3.5–5.0)
Alkaline Phosphatase: 54 U/L (ref 38–126)
Anion gap: 10 (ref 5–15)
BUN: 29 mg/dL — ABNORMAL HIGH (ref 6–20)
CO2: 19 mmol/L — ABNORMAL LOW (ref 22–32)
Calcium: 8.7 mg/dL — ABNORMAL LOW (ref 8.9–10.3)
Chloride: 107 mmol/L (ref 98–111)
Creatinine, Ser: 1.81 mg/dL — ABNORMAL HIGH (ref 0.44–1.00)
GFR calc Af Amer: 40 mL/min — ABNORMAL LOW (ref >60)
GFR calc non Af Amer: 34 mL/min — ABNORMAL LOW (ref >60)
Glucose, Bld: 105 mg/dL — ABNORMAL HIGH (ref 70–99)
Potassium: 4.5 mmol/L (ref 3.5–5.1)
Sodium: 136 mmol/L (ref 135–145)
Total Bilirubin: 0.3 mg/dL (ref 0.3–1.2)
Total Protein: 7.1 g/dL (ref 6.5–8.1)

## 2020-07-01 LAB — TROPONIN I (HIGH SENSITIVITY): Troponin I (High Sensitivity): <2 ng/L (ref <18)

## 2020-07-01 NOTE — ED Triage Notes (Signed)
C/o chest pain x 3 days , denies SOB or nausea

## 2020-07-02 ENCOUNTER — Emergency Department (HOSPITAL_BASED_OUTPATIENT_CLINIC_OR_DEPARTMENT_OTHER)
Admission: EM | Admit: 2020-07-02 | Discharge: 2020-07-02 | Disposition: A | Discharge status: Home / Self Care | Payer: No Typology Code available for payment source | Attending: Emergency Medicine | Admitting: Emergency Medicine

## 2020-07-02 DIAGNOSIS — R079 Chest pain, unspecified: Secondary | ICD-10-CM

## 2020-07-02 LAB — TROPONIN I (HIGH SENSITIVITY): Troponin I (High Sensitivity): <2 ng/L (ref <18)

## 2020-07-02 NOTE — ED Provider Notes (Signed)
[facility] Provider Note   CSN: [id number] Arrival date & time: 07/01/20  2122     History Chief Complaint  Patient presents with  . Chest Pain    Samantha Barr is a 39 y.o. female.  Patient presents to the emergency department for evaluation of chest pain.  She has been having intermittent chest pain for 3 days.  She came in tonight with chest pain that lasted for approximately 2 hours but now has resolved.  No associated shortness of breath, nausea or diaphoresis.  Pain was not exertional in nature.  She has no history of heart disease.  The only family history is her mother had a heart attack sometime in her 4s.     HPI: A 39 year old patient with a history of hypertension presents for evaluation of chest pain. Initial onset of pain was approximately 1-3 hours ago. The patient's chest pain is not worse with exertion. The patient's chest pain is middle- or left-sided, is not well-localized, is not described as heaviness/pressure/tightness, is not sharp and does not radiate to the arms/jaw/neck. The patient does not complain of nausea and denies diaphoresis. The patient has no history of stroke, has no history of peripheral artery disease, has not smoked in the past 90 days, denies any history of treated diabetes, has no relevant family history of coronary artery disease (first degree relative at less than age 13), has no history of hypercholesterolemia and does not have an elevated BMI (>=30).   Past Medical History:  Diagnosis Date  . Hypertension   . Renal disorder     Patient Active Problem List   Diagnosis Date Noted  . Essential hypertension 03/05/2018  . Stage 3 chronic kidney disease 03/05/2018    History reviewed. No pertinent surgical history.   OB History   No obstetric history on file.     Family History  Problem Relation Age of Onset  . Hypertension Maternal Aunt     Social History   Tobacco Use  . Smoking status: Never  Smoker  . Smokeless tobacco: Never Used  Vaping Use  . Vaping Use: Never used  Substance Use Topics  . Alcohol use: No  . Drug use: No    Home Medications Prior to Admission medications   Medication Sig Start Date End Date Taking? Authorizing Provider  amLODipine (NORVASC) 5 MG tablet Take 1 tablet (5 mg total) by mouth daily. Please fill as a 90 day supply 03/04/20   Gildardo Pounds, NP  fluticasone [facility]) 50 MCG/ACT nasal spray Place 1 spray into both nostrils daily. 06/13/18 07/13/18  Gildardo Pounds, NP  gabapentin (NEURONTIN) 300 MG capsule Take 1 capsule (300 mg total) by mouth at bedtime. Please fill as a 90 day supply 03/04/20 06/02/20  Gildardo Pounds, NP  losartan (COZAAR) 100 MG tablet Take 1 tablet (100 mg total) by mouth daily. Please fill as a 90 day supply 03/04/20 06/02/20  Gildardo Pounds, NP  Misc. Devices MISC Please provide patient with an insurance approved right wrist/hand splint. 06/13/18   Gildardo Pounds, NP  rosuvastatin (CRESTOR) 10 MG tablet Take 1 tablet (10 mg total) by mouth daily. Please fill as a 90 day supply 03/04/20 06/02/20  Gildardo Pounds, NP    Allergies    Patient has no known allergies.  Review of Systems   Review of Systems  Cardiovascular: Positive for chest pain.  All other systems reviewed and are negative.   Physical Exam Updated Vital  Signs BP 115/86   Pulse 89   Temp 98.4 F (36.9 C) (Oral)   Resp (!) 22   Wt 68 kg   LMP 06/28/2020   SpO2 100%   BMI 24.96 kg/m   Physical Exam Vitals and nursing note reviewed.  Constitutional:      General: She is not in acute distress.    Appearance: Normal appearance. She is well-developed.  HENT:     Head: Normocephalic and atraumatic.     Right Ear: Hearing normal.     Left Ear: Hearing normal.     Nose: Nose normal.  Eyes:     Conjunctiva/sclera: Conjunctivae normal.     Pupils: Pupils are equal, round, and reactive to light.  Cardiovascular:     Rate and Rhythm: Regular rhythm.      Heart sounds: S1 normal and S2 normal. No murmur heard.  No friction rub. No gallop.   Pulmonary:     Effort: Pulmonary effort is normal. No respiratory distress.     Breath sounds: Normal breath sounds.  Chest:     Chest wall: No tenderness.  Abdominal:     General: Bowel sounds are normal.     Palpations: Abdomen is soft.     Tenderness: There is no abdominal tenderness. There is no guarding or rebound. Negative signs include Murphy's sign and McBurney's sign.     Hernia: No hernia is present.  Musculoskeletal:        General: Normal range of motion.     Cervical back: Normal range of motion and neck supple.  Skin:    General: Skin is warm and dry.     Findings: No rash.  Neurological:     Mental Status: She is alert and oriented to person, place, and time.     GCS: GCS eye subscore is 4. GCS verbal subscore is 5. GCS motor subscore is 6.     Cranial Nerves: No cranial nerve deficit.     Sensory: No sensory deficit.     Coordination: Coordination normal.  Psychiatric:        Speech: Speech normal.        Behavior: Behavior normal.        Thought Content: Thought content normal.     ED Results / Procedures / Treatments   Labs (all labs ordered are listed, but only abnormal results are displayed) Labs Reviewed  CBC WITH DIFFERENTIAL/PLATELET - Abnormal; Notable for the following components:      Result Value   Hemoglobin 10.5 (*)    HCT 33.9 (*)    RDW 15.7 (*)    Eosinophils Absolute 0.6 (*)    All other components within normal limits  COMPREHENSIVE METABOLIC PANEL - Abnormal; Notable for the following components:   CO2 19 (*)    Glucose, Bld 105 (*)    BUN 29 (*)    Creatinine, Ser 1.81 (*)    Calcium 8.7 (*)    GFR calc non Af Amer 34 (*)    GFR calc Af Amer 40 (*)    All other components within normal limits  TROPONIN I (HIGH SENSITIVITY)  TROPONIN I (HIGH SENSITIVITY)    EKG EKG Interpretation  Date/Time:  Wednesday July 01 2020 21:31:40  EDT Ventricular Rate:  91 PR Interval:  132 QRS Duration: 76 QT Interval:  334 QTC Calculation: 410 R Axis:   76 Text Interpretation: Normal sinus rhythm Normal ECG No old tracing to compare Confirmed by Aletta Edouard [phone]) on 07/01/2020  9:40:07 PM   Radiology DG Chest 2 View  Result Date: 07/01/2020 CLINICAL DATA:  Chest pain EXAM: CHEST - 2 VIEW COMPARISON:  None. FINDINGS: The heart size and mediastinal contours are within normal limits. Both lungs are clear. The visualized skeletal structures are unremarkable. IMPRESSION: No active cardiopulmonary disease. Electronically Signed   By: Prudencio Pair M.D.   On: 07/01/2020 21:46    Procedures Procedures (including critical care time)  Medications Ordered in ED Medications - No data to display  ED Course  I have reviewed the triage vital signs and the nursing notes.  Pertinent labs & imaging results that were available during my care of the patient were reviewed by me and considered in my medical decision making (see chart for details).    MDM Rules/Calculators/A&P HEAR Score: 1                        Patient presents to the emergency department for evaluation of chest pain.  Patient had atypical, nonexertional chest pain that lasted for 2 hours and resolved.  She has been having intermittent episodes over the last few days.  Cardiac risk factors are history of hypertension.  Heart score is 1.  Patient has had 2 - troponins.  EKG is normal.  She is felt to be very low risk for cardiac etiology, recommend cardiology follow-up.  Final Clinical Impression(s) / ED Diagnoses Final diagnoses:  Chest pain, unspecified type    Rx / DC Orders ED Discharge Orders    None       Orpah Greek, MD 07/02/20 [phone]

## 2020-07-27 ENCOUNTER — Ambulatory Visit: Payer: No Typology Code available for payment source | Admitting: Internal Medicine

## 2020-07-27 NOTE — Progress Notes (Deleted)
Cardiology Office Note:    Date:  07/27/2020   ID:  Samantha Barr, DOB May 31, 1981, MRN [id number]  PCP:  Gildardo Pounds, NP  [facility] Cardiologist:  No primary care provider on file.  [facility] Electrophysiologist:  None   Referring MD: Gildardo Pounds, NP   CC: Eval of Chest pain Consulted for the evaluation of *** at the behest of Raul Del, Zelda W, NP   History of Present Illness:    Samantha Barr is a 39 y.o. female with a hx of Essential hypertension, CKD stage 3b who presents for follow up after CP eval 07/02/20.  Patient seen ED with 3 days of chest pain (middle to left sided).  Had 2 negative troponins, without ischemic ekg, with atypical and non-exertional CP; was safely discharged.  Patient notes  Past Medical History:  Diagnosis Date  . Hypertension   . Renal disorder     No past surgical history on file.  Current Medications: No outpatient medications have been marked as taking for the 07/27/20 encounter (Appointment) with Werner Lean, MD.     Allergies:   Patient has no known allergies.   Social History   Socioeconomic History  . Marital status: Married    Spouse name: Not on file  . Number of children: Not on file  . Years of education: Not on file  . Highest education level: Not on file  Occupational History  . Not on file  Tobacco Use  . Smoking status: Never Smoker  . Smokeless tobacco: Never Used  Vaping Use  . Vaping Use: Never used  Substance and Sexual Activity  . Alcohol use: No  . Drug use: No  . Sexual activity: Yes  Other Topics Concern  . Not on file  Social History Narrative  . Not on file   Social Determinants of Health   Financial Resource Strain:   . Difficulty of Paying Living Expenses: Not on file  Food Insecurity:   . Worried About Charity fundraiser in the Last Year: Not on file  . Ran Out of Food in the Last Year: Not on file  Transportation Needs:   . Lack of Transportation (Medical): Not  on file  . Lack of Transportation (Non-Medical): Not on file  Physical Activity:   . Days of Exercise per Week: Not on file  . Minutes of Exercise per Session: Not on file  Stress:   . Feeling of Stress : Not on file  Social Connections:   . Frequency of Communication with Friends and Family: Not on file  . Frequency of Social Gatherings with Friends and Family: Not on file  . Attends Religious Services: Not on file  . Active Member of Clubs or Organizations: Not on file  . Attends Archivist Meetings: Not on file  . Marital Status: Not on file     Family History: The patient's ***family history includes Hypertension in her maternal aunt.  ROS:   Please see the history of present illness.    *** All other systems reviewed and are negative.  EKGs/Labs/Other Studies Reviewed:    The following studies were reviewed today: ***  EKG:   07/02/20:  Sinus 91 no RVH, but iRBBB  Recent Labs: 07/01/2020: ALT 13; BUN 29; Creatinine, Ser 1.81; Hemoglobin 10.5; Platelets 227; Potassium 4.5; Sodium 136  Recent Lipid Panel    Component Value Date/Time   CHOL 159 01/28/2020 1155   TRIG 261 (H) 01/28/2020 1155   HDL 31 (  L) 01/28/2020 1155   CHOLHDL 5.1 (H) 01/28/2020 1155   LDLCALC 84 01/28/2020 1155    Physical Exam:    VS:  LMP 06/28/2020     Wt Readings from Last 3 Encounters:  07/01/20 150 lb (68 kg)  03/04/20 155 lb (70.3 kg)  01/28/20 152 lb (68.9 kg)     GEN: *** Well nourished, well developed in no acute distress HEENT: Normal NECK: No JVD; No carotid bruits LYMPHATICS: No lymphadenopathy CARDIAC: ***RRR, no murmurs, rubs, gallops RESPIRATORY:  Clear to auscultation without rales, wheezing or rhonchi  ABDOMEN: Soft, non-tender, non-distended MUSCULOSKELETAL:  No edema; No deformity  SKIN: Warm and dry NEUROLOGIC:  Alert and oriented x 3 PSYCHIATRIC:  Normal affect   ASSESSMENT:    1. Chest pain of uncertain etiology   2. Essential hypertension   3.  Stage 3b chronic kidney disease (HCC)    PLAN:    In order of problems listed above:  1. ***   Medication Adjustments/Labs and Tests Ordered: Current medicines are reviewed at length with the patient today.  Concerns regarding medicines are outlined above.  No orders of the defined types were placed in this encounter.  No orders of the defined types were placed in this encounter.   There are no Patient Instructions on file for this visit.   Signed, Werner Lean, MD  07/27/2020 9:36 AM    [place]

## 2020-08-07 ENCOUNTER — Other Ambulatory Visit: Payer: Self-pay

## 2020-08-07 ENCOUNTER — Encounter: Payer: Self-pay | Admitting: Nurse Practitioner

## 2020-08-07 ENCOUNTER — Ambulatory Visit: Payer: No Typology Code available for payment source

## 2020-08-07 ENCOUNTER — Ambulatory Visit: Payer: Self-pay | Attending: Nurse Practitioner | Admitting: Nurse Practitioner

## 2020-08-07 DIAGNOSIS — E782 Mixed hyperlipidemia: Secondary | ICD-10-CM

## 2020-08-07 DIAGNOSIS — I10 Essential (primary) hypertension: Secondary | ICD-10-CM

## 2020-08-07 NOTE — Progress Notes (Signed)
Virtual Visit via Telephone Note Due to national recommendations of social distancing due to [place] 19, telehealth visit is felt to be most appropriate for this patient at this time.  I discussed the limitations, risks, security and privacy concerns of performing an evaluation and management service by telephone and the availability of in person appointments. I also discussed with the patient that there may be a patient responsible charge related to this service. The patient expressed understanding and agreed to proceed.    I connected with Samantha Barr on 08/07/20  at   3:50 PM EDT  EDT by telephone and verified that I am speaking with the correct person using two identifiers.   Consent I discussed the limitations, risks, security and privacy concerns of performing an evaluation and management service by telephone and the availability of in person appointments. I also discussed with the patient that there may be a patient responsible charge related to this service. The patient expressed understanding and agreed to proceed.   Location of Patient: Private  Residence   Location of Provider: [place] and [place] participating in Telemedicine visit: Geryl Rankins FNP-BC [place]    History of Present Illness: Telemedicine visit for: F/U  has a past medical history of Hypertension and Renal disorder.  Last appt with nephrology was June 2021. She will f/u in November or December. She can not recall the month.   Last month she had onset of chest pain which lasted for 2 days. She was seen in the ED and work up was essentially normal. Although she was low risk they recommended she follow up with Cardiology. She currently denies chest pain, shortness of breath, palpitations, lightheadedness, dizziness, headaches or BLE edema.  The 10-year ASCVD risk score Mikey Bussing DC Brooke Bonito., et al., 2013) is: 1.2%   Values used to calculate the score:     Age: 39  years     Sex: Female     Is Non-Hispanic African American: No     Diabetic: No     Tobacco smoker: No     Systolic Blood Pressure: 443 mmHg     Is BP treated: Yes     HDL Cholesterol: 31 mg/dL     Total Cholesterol: 159 mg/dL  Lab Results  Component Value Date   CHOL 159 01/28/2020   CHOL 172 08/09/2019   CHOL 234 (H) 09/17/2018   Lab Results  Component Value Date   HDL 31 (L) 01/28/2020   HDL 33 (L) 08/09/2019   HDL 32 (L) 09/17/2018   Lab Results  Component Value Date   LDLCALC 84 01/28/2020   LDLCALC 100 (H) 08/09/2019   LDLCALC Comment 09/17/2018   Lab Results  Component Value Date   TRIG 261 (H) 01/28/2020   TRIG 228 (H) 08/09/2019   TRIG 442 (H) 09/17/2018   Lab Results  Component Value Date   CHOLHDL 5.1 (H) 01/28/2020   CHOLHDL 5.2 (H) 08/09/2019   CHOLHDL 7.3 (H) 09/17/2018    Essential Hypertension Well controlled with losartan 100 mg daily and amlodipine 5 mg daily.  BP Readings from Last 3 Encounters:  07/02/20 114/84  03/04/20 115/75  01/28/20 112/75     Past Medical History:  Diagnosis Date   Hypertension    Renal disorder     History reviewed. No pertinent surgical history.  Family History  Problem Relation Age of Onset   Hypertension Maternal Aunt     Social History  Socioeconomic History   Marital status: Married    Spouse name: Not on file   Number of children: Not on file   Years of education: Not on file   Highest education level: Not on file  Occupational History   Not on file  Tobacco Use   Smoking status: Never Smoker   Smokeless tobacco: Never Used  Vaping Use   Vaping Use: Never used  Substance and Sexual Activity   Alcohol use: No   Drug use: No   Sexual activity: Yes  Other Topics Concern   Not on file  Social History Narrative   Not on file   Social Determinants of Health   Financial Resource Strain:    Difficulty of Paying Living Expenses: Not on file  Food Insecurity:    Worried  About [place] in the Last Year: Not on file   Ran Out of Food in the Last Year: Not on file  Transportation Needs:    Lack of Transportation (Medical): Not on file   Lack of Transportation (Non-Medical): Not on file  Physical Activity:    Days of Exercise per Week: Not on file   Minutes of Exercise per Session: Not on file  Stress:    Feeling of Stress : Not on file  Social Connections:    Frequency of Communication with Friends and Family: Not on file   Frequency of Social Gatherings with Friends and Family: Not on file   Attends Religious Services: Not on file   Active Member of Clubs or Organizations: Not on file   Attends Archivist Meetings: Not on file   Marital Status: Not on file     Observations/Objective: Awake, alert and oriented x 3   Review of Systems  Constitutional: Negative for fever, malaise/fatigue and weight loss.  HENT: Negative.  Negative for nosebleeds.   Eyes: Negative.  Negative for blurred vision, double vision and photophobia.  Respiratory: Negative.  Negative for cough and shortness of breath.   Cardiovascular: Negative.  Negative for chest pain, palpitations and leg swelling.  Gastrointestinal: Negative.  Negative for heartburn, nausea and vomiting.  Musculoskeletal: Negative.  Negative for myalgias.  Neurological: Negative.  Negative for dizziness, focal weakness, seizures and headaches.  Psychiatric/Behavioral: Negative.  Negative for suicidal ideas.    Assessment and Plan: Samantha Barr was seen today for chest pain.  Diagnoses and all orders for this visit:  Essential hypertension -     losartan (COZAAR) 100 MG tablet; Take 1 tablet (100 mg total) by mouth daily. Please fill as a 90 day supply Continue all antihypertensives as prescribed.  Remember to bring in your blood pressure log with you for your follow up appointment.  DASH/Mediterranean Diets are healthier choices for HTN.    Mixed hyperlipidemia -      rosuvastatin (CRESTOR) 10 MG tablet; Take 1 tablet (10 mg total) by mouth daily. Please fill as a 90 day supply INSTRUCTIONS: Work on a low fat, heart healthy diet and participate in regular aerobic exercise program by working out at least 150 minutes per week; 5 days a week-30 minutes per day. Avoid red meat/beef/steak,  fried foods. junk foods, sodas, sugary drinks, unhealthy snacking, alcohol and smoking.  Drink at least 80 oz of water per day and monitor your carbohydrate intake daily.      Follow Up Instructions Return in about 3 months (around 11/07/2020).     I discussed the assessment and treatment plan with the patient. The patient was  provided an opportunity to ask questions and all were answered. The patient agreed with the plan and demonstrated an understanding of the instructions.   The patient was advised to call back or seek an in-person evaluation if the symptoms worsen or if the condition fails to improve as anticipated.  I provided 17 minutes of non-face-to-face time during this encounter including median intraservice time, reviewing previous notes, labs, imaging, medications and explaining diagnosis and management.  Gildardo Pounds, FNP-BC

## 2020-08-08 ENCOUNTER — Other Ambulatory Visit: Payer: Self-pay | Admitting: Nurse Practitioner

## 2020-08-08 ENCOUNTER — Encounter: Payer: Self-pay | Admitting: Nurse Practitioner

## 2020-08-08 MED ORDER — ROSUVASTATIN CALCIUM 10 MG PO TABS: 10.0000 mg | ORAL_TABLET | Freq: Every day | ORAL | 3 refills | Status: DC

## 2020-08-08 MED ORDER — LOSARTAN POTASSIUM 100 MG PO TABS: 100.0000 mg | ORAL_TABLET | Freq: Every day | ORAL | 1 refills | Status: DC

## 2020-08-10 MED FILL — ROSUVASTATIN CALCIUM 10 MG: 10 | 90 days supply | Qty: 90 | Fill #0

## 2020-08-10 MED FILL — LOSARTAN POTASSIUM 100 MG T: 100 | 90 days supply | Qty: 90 | Fill #0

## 2020-08-12 ENCOUNTER — Ambulatory Visit: Payer: No Typology Code available for payment source

## 2020-08-24 ENCOUNTER — Ambulatory Visit: Payer: Self-pay | Attending: Nurse Practitioner | Admitting: Nurse Practitioner

## 2020-08-24 ENCOUNTER — Other Ambulatory Visit: Payer: Self-pay

## 2020-08-24 ENCOUNTER — Encounter: Payer: Self-pay | Admitting: Nurse Practitioner

## 2020-08-24 VITALS — BP 117/79 | HR 76 | Ht 65.0 in | Wt 156.2 lb

## 2020-08-24 DIAGNOSIS — G5791 Unspecified mononeuropathy of right lower limb: Secondary | ICD-10-CM

## 2020-08-24 DIAGNOSIS — H61891 Other specified disorders of right external ear: Secondary | ICD-10-CM

## 2020-08-24 DIAGNOSIS — I10 Essential (primary) hypertension: Secondary | ICD-10-CM

## 2020-08-24 MED ORDER — GABAPENTIN 100 MG PO CAPS: 100.0000 mg | ORAL_CAPSULE | Freq: Every day | ORAL | 0 refills | Status: DC

## 2020-08-24 MED FILL — GABAPENTIN 100 MG CAPSULE: 100 | 90 days supply | Qty: 90 | Fill #0

## 2020-08-24 NOTE — Patient Instructions (Signed)
Deliah Goody MD Address: [address] [place], [place] [address] Phone: [phone]

## 2020-08-24 NOTE — Progress Notes (Signed)
Assessment & Plan:  Samantha Barr was seen today for hypertension.  Diagnoses and all orders for this visit:  Essential hypertension Continue all antihypertensives as prescribed.  Remember to bring in your blood pressure log with you for your follow up appointment.  DASH/Mediterranean Diets are healthier choices for HTN.    Neuritis of right foot -     gabapentin (NEURONTIN) 100 MG capsule; Take 1 capsule (100 mg total) by mouth at bedtime. Please fill as a 90 day supply  Nodule of external ear, right -     US Soft Tissue Head/Neck (NON-THYROID); Future    Patient has been counseled on age-appropriate routine health concerns for screening and prevention. These are reviewed and up-to-date. Referrals have been placed accordingly. Immunizations are up-to-date or declined.    Subjective:   Chief Complaint  Patient presents with  . Hypertension   HPI Samantha Barr 39 y.o. female presents to office today for follow-up.  has a past medical history of Hypertension and Renal disorder.   ESSENTIAL HYPERTENSION Blood pressure is well controlled today with losartan 100 mg daily and amlodipine 5 mg daily. Denies chest pain, shortness of breath, palpitations, lightheadedness, dizziness, headaches or BLE edema.  BP Readings from Last 3 Encounters:  08/24/20 117/79  07/02/20 114/84  03/04/20 115/75    Right foot neuropathy Well controlled with gabapentin 300 mg at bedtime however she experiences excessive drowsiness with current dose.  We will decrease to 100 mg.  hReview of Systems  Constitutional: Negative for fever, malaise/fatigue and weight loss.  HENT: Negative.  Negative for nosebleeds.   Eyes: Negative for double vision, photophobia, pain, discharge and redness.       Right eye floaters  Respiratory: Negative.  Negative for cough and shortness of breath.   Cardiovascular: Negative.  Negative for chest pain, palpitations and leg swelling.  Gastrointestinal: Negative.  Negative  for heartburn, nausea and vomiting.  Musculoskeletal: Negative.  Negative for myalgias.       Right foot pain  Neurological: Positive for tingling and sensory change. Negative for dizziness, focal weakness, seizures and headaches.  Psychiatric/Behavioral: Negative.  Negative for suicidal ideas.    Past Medical History:  Diagnosis Date  . Hypertension   . Renal disorder     History reviewed. No pertinent surgical history.  Family History  Problem Relation Age of Onset  . Hypertension Maternal Aunt     Social History Reviewed with no changes to be made today.   Outpatient Medications Prior to Visit  Medication Sig Dispense Refill  . amLODipine (NORVASC) 5 MG tablet Take 1 tablet (5 mg total) by mouth daily. Please fill as a 90 day supply 90 tablet 3  . losartan (COZAAR) 100 MG tablet Take 1 tablet (100 mg total) by mouth daily. Please fill as a 90 day supply 90 tablet 1  . rosuvastatin (CRESTOR) 10 MG tablet Take 1 tablet (10 mg total) by mouth daily. Please fill as a 90 day supply 90 tablet 3  . fluticasone (FLONASE) 50 MCG/ACT nasal spray Place 1 spray into both nostrils daily. 16 g 2  . gabapentin (NEURONTIN) 300 MG capsule Take 1 capsule (300 mg total) by mouth at bedtime. Please fill as a 90 day supply 90 capsule 0   No facility-administered medications prior to visit.    No Known Allergies     Objective:    BP 117/79   Pulse 76   Ht 5\' 5"  (1.651 m)   Wt 156 lb 3.2 oz (70.9  kg)   SpO2 100%   BMI 25.99 kg/m  Wt Readings from Last 3 Encounters:  08/24/20 156 lb 3.2 oz (70.9 kg)  07/01/20 150 lb (68 kg)  03/04/20 155 lb (70.3 kg)    Physical Exam Vitals and nursing note reviewed.  Constitutional:      Appearance: She is well-developed.  HENT:     Head: Normocephalic and atraumatic.      Right Ear: Hearing, tympanic membrane, ear canal and external ear normal.     Left Ear: Hearing, tympanic membrane, ear canal and external ear normal.  Cardiovascular:      Rate and Rhythm: Normal rate and regular rhythm.     Heart sounds: Normal heart sounds. No murmur heard.  No friction rub. No gallop.   Pulmonary:     Effort: Pulmonary effort is normal. No tachypnea or respiratory distress.     Breath sounds: Normal breath sounds. No decreased breath sounds, wheezing, rhonchi or rales.  Chest:     Chest wall: No tenderness.  Abdominal:     General: Bowel sounds are normal.     Palpations: Abdomen is soft.  Musculoskeletal:        General: Normal range of motion.     Cervical back: Normal range of motion.  Skin:    General: Skin is warm and dry.     Findings: Lesion (SEE above HENT EXAM note) present.  Neurological:     Mental Status: She is alert and oriented to person, place, and time.     Coordination: Coordination normal.  Psychiatric:        Behavior: Behavior normal. Behavior is cooperative.        Thought Content: Thought content normal.        Judgment: Judgment normal.          Patient has been counseled extensively about nutrition and exercise as well as the importance of adherence with medications and regular follow-up. The patient was given clear instructions to go to ER or return to medical center if symptoms don't improve, worsen or new problems develop. The patient verbalized understanding.   Follow-up: Return in about 3 months (around 11/24/2020).   Gildardo Pounds, FNP-BC [facility] and [place], [place]   08/24/2020, 2:07 PM

## 2020-08-31 ENCOUNTER — Other Ambulatory Visit: Payer: Self-pay

## 2020-08-31 ENCOUNTER — Ambulatory Visit: Payer: No Typology Code available for payment source | Attending: Nurse Practitioner

## 2020-09-11 ENCOUNTER — Telehealth: Payer: Self-pay | Admitting: Nurse Practitioner

## 2020-09-11 NOTE — Telephone Encounter (Signed)
I return Pt call, she was inform that the bank paper she submit is the bank transaction, she need th bank statement to complete her financial application

## 2020-09-11 NOTE — Telephone Encounter (Signed)
Copied from [place] [id number]. Topic: General - Other >> Sep 11, 2020 10:36 AM Leward Quan A wrote: Reason for CRM: Patient called to ask Clifton James about the paper work that she submitted she stated that all paper work requested was submitted but yet she received a call asking for bank statement. Please call patient at Ph# [phone]

## 2020-10-12 MED FILL — AMLODIPINE BESYLATE 5 MG TA: 5 | 90 days supply | Qty: 90 | Fill #1

## 2020-10-12 MED FILL — ROSUVASTATIN CALCIUM 10 MG: 10 | 90 days supply | Qty: 90 | Fill #0

## 2020-10-12 MED FILL — LOSARTAN POTASSIUM 100 MG T: 100 | 90 days supply | Qty: 90 | Fill #0

## 2020-11-04 ENCOUNTER — Encounter: Payer: Self-pay | Admitting: Nurse Practitioner

## 2020-11-04 ENCOUNTER — Ambulatory Visit: Payer: 59 | Attending: Nurse Practitioner | Admitting: Nurse Practitioner

## 2020-11-04 ENCOUNTER — Other Ambulatory Visit: Payer: Self-pay

## 2020-11-04 DIAGNOSIS — R42 Dizziness and giddiness: Secondary | ICD-10-CM

## 2020-11-04 DIAGNOSIS — N1832 Chronic kidney disease, stage 3b: Secondary | ICD-10-CM

## 2020-11-04 NOTE — Progress Notes (Signed)
Virtual Visit via Telephone Note Due to national recommendations of social distancing due to [place] 19, telehealth visit is felt to be most appropriate for this patient at this time.  I discussed the limitations, risks, security and privacy concerns of performing an evaluation and management service by telephone and the availability of in person appointments. I also discussed with the patient that there may be a patient responsible charge related to this service. The patient expressed understanding and agreed to proceed.    I connected with Samantha Barr on 11/04/20  at  11:10 AM EST  EDT by telephone and verified that I am speaking with the correct person using two identifiers.   Consent I discussed the limitations, risks, security and privacy concerns of performing an evaluation and management service by telephone and the availability of in person appointments. I also discussed with the patient that there may be a patient responsible charge related to this service. The patient expressed understanding and agreed to proceed.   Location of Patient: [place], arts of Provider: [place] and [employer] Office    Persons participating in Telemedicine visit: Samantha Rankins FNP-BC [place]    History of Present Illness: Telemedicine visit for: Dizziness  Vertigo - Dizziness: Patient presents with complaints of dizziness.  The dizziness has been present for a few months. The patient describes the symptoms as disequalibirum. Symptoms are exacerbated by rising from supine position, rising from squatting or sitting position and walking. She denies tinnitus, hearing loss, headaches, visual disturbances or otalgia.  She has a history of anemia. Will also check thyroid level as well as CBC today.   She is requesting a letter today to be submitted to the [place] [employer] to allow her mother to come to the [place] for emotional and physical  support. Her spouse passed last year and she has 5 children with no family currently residing here to assist.    Past Medical History:  Diagnosis Date  . Hypertension   . Renal disorder     History reviewed. No pertinent surgical history.  Family History  Problem Relation Age of Onset  . Hypertension Maternal Aunt     Social History   Socioeconomic History  . Marital status: Widowed    Spouse name: Not on file  . Number of children: Not on file  . Years of education: Not on file  . Highest education level: Not on file  Occupational History  . Not on file  Tobacco Use  . Smoking status: Never Smoker  . Smokeless tobacco: Never Used  Vaping Use  . Vaping Use: Never used  Substance and Sexual Activity  . Alcohol use: No  . Drug use: No  . Sexual activity: Yes  Other Topics Concern  . Not on file  Social History Narrative  . Not on file   Social Determinants of Health   Financial Resource Strain: Not on file  Food Insecurity: Not on file  Transportation Needs: Not on file  Physical Activity: Not on file  Stress: Not on file  Social Connections: Not on file     Observations/Objective: Awake, alert and oriented x 3   Review of Systems  Constitutional: Negative for fever, malaise/fatigue and weight loss.  HENT: Negative.  Negative for nosebleeds.   Eyes: Negative.  Negative for blurred vision, double vision and photophobia.  Respiratory: Negative.  Negative for cough and shortness of breath.   Cardiovascular: Negative.  Negative for chest  pain, palpitations and leg swelling.  Gastrointestinal: Negative.  Negative for heartburn, nausea and vomiting.  Musculoskeletal: Negative.  Negative for myalgias.  Neurological: Positive for dizziness. Negative for focal weakness, seizures and headaches.  Psychiatric/Behavioral: Positive for depression. Negative for suicidal ideas.    Assessment and Plan: [name] was seen today for dizziness.  Diagnoses and all orders for  this visit:  Dizziness -     TSH -     CBC -     Iron, TIBC and Ferritin Panel  Stage 3b chronic kidney disease (HCC) -     CMP14+EGFR  Low calcium levels -     VITAMIN D 25 Hydroxy (Vit-D Deficiency, Fractures)     Follow Up Instructions Return in about 3 months (around 02/02/2021).     I discussed the assessment and treatment plan with the patient. The patient was provided an opportunity to ask questions and all were answered. The patient agreed with the plan and demonstrated an understanding of the instructions.   The patient was advised to call back or seek an in-person evaluation if the symptoms worsen or if the condition fails to improve as anticipated.  I provided 15 minutes of non-face-to-face time during this encounter including median intraservice time, reviewing previous notes, labs, imaging, medications and explaining diagnosis and management.  Gildardo Pounds, FNP-BC

## 2020-11-05 LAB — CBC
Hematocrit: 37.9 % (ref 34.0–46.6)
Hemoglobin: 11.9 g/dL (ref 11.1–15.9)
MCH: 25.5 pg — ABNORMAL LOW (ref 26.6–33.0)
MCHC: 31.4 g/dL — ABNORMAL LOW (ref 31.5–35.7)
MCV: 81 fL (ref 79–97)
Platelets: 242 10*3/uL (ref 150–450)
RBC: 4.67 x10E6/uL (ref 3.77–5.28)
RDW: 16.2 % — ABNORMAL HIGH (ref 11.7–15.4)
WBC: 4.8 10*3/uL (ref 3.4–10.8)

## 2020-11-05 LAB — IRON,TIBC AND FERRITIN PANEL
Ferritin: 16 ng/mL (ref 15–150)
Iron Saturation: 8 % — CL (ref 15–55)
Iron: 26 ug/dL — ABNORMAL LOW (ref 27–159)
Total Iron Binding Capacity: 327 ug/dL (ref 250–450)
UIBC: 301 ug/dL (ref 131–425)

## 2020-11-05 LAB — CMP14+EGFR
ALT: 9 IU/L (ref 0–32)
AST: 17 IU/L (ref 0–40)
Albumin/Globulin Ratio: 1.4 (ref 1.2–2.2)
Albumin: 4.2 g/dL (ref 3.8–4.8)
Alkaline Phosphatase: 75 IU/L (ref 44–121)
BUN/Creatinine Ratio: 18 (ref 9–23)
BUN: 29 mg/dL — ABNORMAL HIGH (ref 6–24)
Bilirubin Total: <0.2 mg/dL (ref 0.0–1.2)
CO2: 19 mmol/L — ABNORMAL LOW (ref 20–29)
Calcium: 9.5 mg/dL (ref 8.7–10.2)
Chloride: 106 mmol/L (ref 96–106)
Creatinine, Ser: 1.64 mg/dL — ABNORMAL HIGH (ref 0.57–1.00)
GFR calc Af Amer: 44 mL/min/{1.73_m2} — ABNORMAL LOW (ref >59)
GFR calc non Af Amer: 39 mL/min/{1.73_m2} — ABNORMAL LOW (ref >59)
Globulin, Total: 2.9 g/dL (ref 1.5–4.5)
Glucose: 92 mg/dL (ref 65–99)
Potassium: 4.9 mmol/L (ref 3.5–5.2)
Sodium: 139 mmol/L (ref 134–144)
Total Protein: 7.1 g/dL (ref 6.0–8.5)

## 2020-11-05 LAB — VITAMIN D 25 HYDROXY (VIT D DEFICIENCY, FRACTURES): Vit D, 25-Hydroxy: 6.8 ng/mL — ABNORMAL LOW (ref 30.0–100.0)

## 2020-11-05 LAB — TSH: TSH: 1.07 u[IU]/mL (ref 0.450–4.500)

## 2020-11-10 ENCOUNTER — Other Ambulatory Visit: Payer: Self-pay | Admitting: Nurse Practitioner

## 2020-11-10 MED ORDER — FERROUS GLUCONATE 324 (38 FE) MG PO TABS: 324.0000 mg | ORAL_TABLET | Freq: Every day | ORAL | 3 refills | Status: DC

## 2020-11-10 MED ORDER — VITAMIN D (ERGOCALCIFEROL) 1.25 MG (50000 UNIT) PO CAPS: 50000.0000 [IU] | ORAL_CAPSULE | ORAL | 1 refills | Status: DC

## 2020-11-10 MED FILL — VIT D2 1.25 MG (50,000 UNIT: 1.25 MG | 84 days supply | Qty: 12 | Fill #0

## 2020-12-04 ENCOUNTER — Other Ambulatory Visit: Payer: Self-pay | Admitting: Nephrology

## 2020-12-14 MED FILL — VIT D2 1.25 MG (50,000 UNIT: 1.25 MG | 84 days supply | Qty: 12 | Fill #0

## 2021-02-02 ENCOUNTER — Ambulatory Visit: Payer: No Typology Code available for payment source | Admitting: Nurse Practitioner

## 2021-02-22 ENCOUNTER — Ambulatory Visit: Payer: 59 | Attending: Nurse Practitioner | Admitting: Nurse Practitioner

## 2021-02-22 ENCOUNTER — Other Ambulatory Visit: Payer: Self-pay

## 2021-02-22 ENCOUNTER — Encounter: Payer: Self-pay | Admitting: Nurse Practitioner

## 2021-02-22 VITALS — BP 130/85 | HR 88 | Ht 66.0 in | Wt 156.0 lb

## 2021-02-22 DIAGNOSIS — D3613 Benign neoplasm of peripheral nerves and autonomic nervous system of lower limb, including hip: Secondary | ICD-10-CM | POA: Diagnosis not present

## 2021-02-22 DIAGNOSIS — E559 Vitamin D deficiency, unspecified: Secondary | ICD-10-CM

## 2021-02-22 DIAGNOSIS — D509 Iron deficiency anemia, unspecified: Secondary | ICD-10-CM | POA: Diagnosis not present

## 2021-02-22 DIAGNOSIS — I10 Essential (primary) hypertension: Secondary | ICD-10-CM

## 2021-02-22 DIAGNOSIS — Z1159 Encounter for screening for other viral diseases: Secondary | ICD-10-CM

## 2021-02-22 DIAGNOSIS — Z1231 Encounter for screening mammogram for malignant neoplasm of breast: Secondary | ICD-10-CM

## 2021-02-22 DIAGNOSIS — E782 Mixed hyperlipidemia: Secondary | ICD-10-CM | POA: Diagnosis not present

## 2021-02-22 NOTE — Progress Notes (Signed)
Assessment & Plan:  Samantha Barr was seen today for hypertension.  Diagnoses and all orders for this visit:  Essential hypertension -     CMP14+EGFR Continue all antihypertensives as prescribed.  Remember to bring in your blood pressure log with you for your follow up appointment.  DASH/Mediterranean Diets are healthier choices for HTN.    Iron deficiency anemia, unspecified iron deficiency anemia type -     CBC  Mixed hyperlipidemia -     Lipid panel INSTRUCTIONS: Work on a low fat, heart healthy diet and participate in regular aerobic exercise program by working out at least 150 minutes per week; 5 days a week-30 minutes per day. Avoid red meat/beef/steak,  fried foods. junk foods, sodas, sugary drinks, unhealthy snacking, alcohol and smoking.  Drink at least 80 oz of water per day and monitor your carbohydrate intake daily.    Neuroma of foot -     Ambulatory referral to Podiatry  Need for hepatitis C screening test -     HCV Ab w Reflex to Quant PCR  Vitamin D deficiency disease -     VITAMIN D 25 Hydroxy (Vit-D Deficiency, Fractures)  Breast cancer screening by mammogram -     MM DIGITAL SCREENING BILATERAL; Future  Patient has been counseled on age-appropriate routine health concerns for screening and prevention. These are reviewed and up-to-date. Referrals have been placed accordingly. Immunizations are up-to-date or declined.    Subjective:   Chief Complaint  Patient presents with  . Hypertension   HPI Samantha Barr 40 y.o. female presents to office today for follow up.  She has a past medical history of Hypertension, Vit D Deficiency and Renal disorder.    Essential Hypertension Blood pressure was well controlled. She is taking amlodipine 5 mg daily and losartan 100 mg daily as prescribed. Denies chest pain, shortness of breath, palpitations, lightheadedness, dizziness, headaches or BLE edema.  BP Readings from Last 3 Encounters:  02/22/21 130/85   08/24/20 117/79  07/02/20 114/84    The 10-year ASCVD risk score Mikey Bussing DC Jr., et al., 2013) is: 2.3%   Values used to calculate the score:     Age: 55 years     Sex: Female     Is Non-Hispanic African American: No     Diabetic: No     Tobacco smoker: No     Systolic Blood Pressure: 620 mmHg     Is BP treated: Yes     HDL Cholesterol: 33 mg/dL     Total Cholesterol: 202 mg/dL Taking rosuvastatin 10 mg daily as prescribed.   She has complaints of Right lateral foot pain. States she was diagnosed with neuroma of the foot and had 2 injections in the past. She did not feel they were effective and she is requesting a second opinion today. Pain is not constant but does occur several times per week.     Review of Systems  Constitutional: Negative for fever, malaise/fatigue and weight loss.  HENT: Negative.  Negative for nosebleeds.   Eyes: Negative.  Negative for blurred vision, double vision and photophobia.  Respiratory: Negative.  Negative for cough and shortness of breath.   Cardiovascular: Negative.  Negative for chest pain, palpitations and leg swelling.  Gastrointestinal: Negative.  Negative for heartburn, nausea and vomiting.  Musculoskeletal: Positive for joint pain. Negative for myalgias.  Neurological: Negative.  Negative for dizziness, focal weakness, seizures and headaches.  Psychiatric/Behavioral: Negative.  Negative for suicidal ideas.    Past Medical  History:  Diagnosis Date  . Hypertension   . Renal disorder     History reviewed. No pertinent surgical history.  Family History  Problem Relation Age of Onset  . Hypertension Maternal Aunt     Social History Reviewed with no changes to be made today.   Outpatient Medications Prior to Visit  Medication Sig Dispense Refill  . amLODipine (NORVASC) 5 MG tablet TAKE 1 TABLET (5 MG TOTAL) BY MOUTH DAILY. 90 tablet 3  . Cholecalciferol (VITAMIN D3) 125 MCG (5000 UT) CAPS 1 (ONE) CAPSULE BY MOUTH DAILY 90 capsule 2   . losartan (COZAAR) 100 MG tablet TAKE 1 TABLET (100 MG TOTAL) BY MOUTH DAILY. 90 tablet 1  . rosuvastatin (CRESTOR) 10 MG tablet TAKE 1 TABLET (10 MG TOTAL) BY MOUTH DAILY 90 tablet 3  . Vitamin D, Ergocalciferol, (DRISDOL) 1.25 MG (50000 UNIT) CAPS capsule TAKE 1 CAPSULE (50,000 UNITS TOTAL) BY MOUTH EVERY 7 (SEVEN) DAYS. 12 capsule 1  . ferrous gluconate (FERGON) 324 MG tablet TAKE 1 TABLET (324 MG TOTAL) BY MOUTH DAILY WITH BREAKFAST. (Patient not taking: Reported on 02/22/2021) 90 tablet 3  . fluticasone (FLONASE) 50 MCG/ACT nasal spray Place 1 spray into both nostrils daily. 16 g 2  . gabapentin (NEURONTIN) 100 MG capsule Take 1 capsule (100 mg total) by mouth at bedtime. Please fill as a 90 day supply 90 capsule 0   No facility-administered medications prior to visit.    No Known Allergies     Objective:    BP 130/85   Pulse 88   Ht 5' 6"  (1.676 m)   Wt 156 lb (70.8 kg)   SpO2 100%   BMI 25.18 kg/m  Wt Readings from Last 3 Encounters:  02/22/21 156 lb (70.8 kg)  08/24/20 156 lb 3.2 oz (70.9 kg)  07/01/20 150 lb (68 kg)    Physical Exam Vitals and nursing note reviewed.  Constitutional:      Appearance: She is well-developed.  HENT:     Head: Normocephalic and atraumatic.  Cardiovascular:     Rate and Rhythm: Normal rate and regular rhythm.     Heart sounds: Normal heart sounds. No murmur heard. No friction rub. No gallop.   Pulmonary:     Effort: Pulmonary effort is normal. No tachypnea or respiratory distress.     Breath sounds: Normal breath sounds. No decreased breath sounds, wheezing, rhonchi or rales.  Chest:     Chest wall: No tenderness.  Abdominal:     General: Bowel sounds are normal.     Palpations: Abdomen is soft.  Musculoskeletal:        General: Normal range of motion.     Cervical back: Normal range of motion.     Right foot: Normal range of motion. No deformity, bunion, Charcot foot, foot drop or prominent metatarsal heads.        Feet:  Feet:     Right foot:     Skin integrity: No ulcer, blister, skin breakdown, erythema, warmth, callus, dry skin or fissure.  Skin:    General: Skin is warm and dry.  Neurological:     Mental Status: She is alert and oriented to person, place, and time.     Coordination: Coordination normal.  Psychiatric:        Behavior: Behavior normal. Behavior is cooperative.        Thought Content: Thought content normal.        Judgment: Judgment normal.  Patient has been counseled extensively about nutrition and exercise as well as the importance of adherence with medications and regular follow-up. The patient was given clear instructions to go to ER or return to medical center if symptoms don't improve, worsen or new problems develop. The patient verbalized understanding.   Follow-up: Return for PAP SMEAR.   Gildardo Pounds, FNP-BC [facility] [place], [place]   02/24/2021, 7:31 PM

## 2021-02-23 LAB — HCV AB W REFLEX TO QUANT PCR: HCV Ab: 0.1 s/co ratio (ref 0.0–0.9)

## 2021-02-23 LAB — CMP14+EGFR
ALT: 7 IU/L (ref 0–32)
AST: 11 IU/L (ref 0–40)
Albumin/Globulin Ratio: 1.5 (ref 1.2–2.2)
Albumin: 4.3 g/dL (ref 3.8–4.8)
Alkaline Phosphatase: 63 IU/L (ref 44–121)
BUN/Creatinine Ratio: 17 (ref 9–23)
BUN: 28 mg/dL — ABNORMAL HIGH (ref 6–24)
Bilirubin Total: <0.2 mg/dL (ref 0.0–1.2)
CO2: 18 mmol/L — ABNORMAL LOW (ref 20–29)
Calcium: 9 mg/dL (ref 8.7–10.2)
Chloride: 108 mmol/L — ABNORMAL HIGH (ref 96–106)
Creatinine, Ser: 1.65 mg/dL — ABNORMAL HIGH (ref 0.57–1.00)
Globulin, Total: 2.8 g/dL (ref 1.5–4.5)
Glucose: 87 mg/dL (ref 65–99)
Potassium: 4.6 mmol/L (ref 3.5–5.2)
Sodium: 141 mmol/L (ref 134–144)
Total Protein: 7.1 g/dL (ref 6.0–8.5)
eGFR: 40 mL/min/{1.73_m2} — ABNORMAL LOW (ref >59)

## 2021-02-23 LAB — HCV INTERPRETATION

## 2021-02-23 LAB — CBC
Hematocrit: 37.1 % (ref 34.0–46.6)
Hemoglobin: 11.8 g/dL (ref 11.1–15.9)
MCH: 27 pg (ref 26.6–33.0)
MCHC: 31.8 g/dL (ref 31.5–35.7)
MCV: 85 fL (ref 79–97)
Platelets: 199 10*3/uL (ref 150–450)
RBC: 4.37 x10E6/uL (ref 3.77–5.28)
RDW: 15.8 % — ABNORMAL HIGH (ref 11.7–15.4)
WBC: 5.6 10*3/uL (ref 3.4–10.8)

## 2021-02-23 LAB — LIPID PANEL
Chol/HDL Ratio: 6.1 ratio — ABNORMAL HIGH (ref 0.0–4.4)
Cholesterol, Total: 202 mg/dL — ABNORMAL HIGH (ref 100–199)
HDL: 33 mg/dL — ABNORMAL LOW (ref >39)
LDL Chol Calc (NIH): 117 mg/dL — ABNORMAL HIGH (ref 0–99)
Triglycerides: 295 mg/dL — ABNORMAL HIGH (ref 0–149)
VLDL Cholesterol Cal: 52 mg/dL — ABNORMAL HIGH (ref 5–40)

## 2021-02-23 LAB — VITAMIN D 25 HYDROXY (VIT D DEFICIENCY, FRACTURES): Vit D, 25-Hydroxy: 19.9 ng/mL — ABNORMAL LOW (ref 30.0–100.0)

## 2021-02-24 ENCOUNTER — Encounter: Payer: Self-pay | Admitting: Nurse Practitioner

## 2021-02-24 MED ORDER — ROSUVASTATIN CALCIUM 20 MG PO TABS
20.0000 mg | ORAL_TABLET | Freq: Every day | ORAL | 1 refills | Status: DC
  Filled 2021-02-24 – 2021-06-25 (×2): qty 90, 90d supply, fill #0

## 2021-02-24 MED ORDER — VITAMIN D (ERGOCALCIFEROL) 1.25 MG (50000 UNIT) PO CAPS
ORAL_CAPSULE | ORAL | 1 refills | Status: AC
  Filled 2021-02-24: qty 12, 84d supply, fill #0

## 2021-02-25 ENCOUNTER — Other Ambulatory Visit: Payer: Self-pay

## 2021-03-04 ENCOUNTER — Other Ambulatory Visit: Payer: Self-pay

## 2021-03-12 ENCOUNTER — Ambulatory Visit (INDEPENDENT_AMBULATORY_CARE_PROVIDER_SITE_OTHER): Payer: 59 | Admitting: Podiatry

## 2021-03-12 ENCOUNTER — Other Ambulatory Visit: Payer: Self-pay

## 2021-03-12 DIAGNOSIS — M19079 Primary osteoarthritis, unspecified ankle and foot: Secondary | ICD-10-CM | POA: Diagnosis not present

## 2021-03-12 DIAGNOSIS — M778 Other enthesopathies, not elsewhere classified: Secondary | ICD-10-CM

## 2021-03-16 ENCOUNTER — Encounter: Payer: Self-pay | Admitting: Podiatry

## 2021-03-16 NOTE — Progress Notes (Signed)
Subjective:  Patient ID: Samantha Barr, female    DOB: 12-19-1980,  MRN: [id number]  Chief Complaint  Patient presents with  . Foot Pain    Pt states right foot pain has not resolved from injections, pt still experiencing pain and it is worse with driving. Pain located dorsally and also posterior ankle.    40 y.o. female presents with the above complaint.  Patient presents with complaint of right dorsal plantar midfoot pain.  She states that she has had received injection in the past by Dr. Milinda Pointer did not help.  She states that this is more of a new complaint.  She is got injections for neuromas in the past.  She states it hurts when driving has not progressively gotten worse.  She would like to discuss steroid injection for that area.  She has not seen anyone else prior to seeing me for this acute complaint.  Her pain scale is 7 out of 10 is dull achy in nature.   Review of Systems: Negative except as noted in the HPI. Denies N/V/F/Ch.  Past Medical History:  Diagnosis Date  . Hypertension   . Renal disorder     Current Outpatient Medications:  .  Cholecalciferol (VITAMIN D3) 125 MCG (5000 UT) CAPS, 1 (ONE) CAPSULE BY MOUTH DAILY, Disp: 90 capsule, Rfl: 2 .  ferrous gluconate (FERGON) 324 MG tablet, TAKE 1 TABLET (324 MG TOTAL) BY MOUTH DAILY WITH BREAKFAST., Disp: 90 tablet, Rfl: 3 .  losartan (COZAAR) 100 MG tablet, TAKE 1 TABLET (100 MG TOTAL) BY MOUTH DAILY., Disp: 90 tablet, Rfl: 1 .  rosuvastatin (CRESTOR) 20 MG tablet, Take 1 tablet (20 mg total) by mouth daily., Disp: 90 tablet, Rfl: 1 .  Vitamin D, Ergocalciferol, (DRISDOL) 1.25 MG (50000 UNIT) CAPS capsule, TAKE 1 CAPSULE (50,000 UNITS TOTAL) BY MOUTH EVERY 7 (SEVEN) DAYS., Disp: 12 capsule, Rfl: 1 .  amLODipine (NORVASC) 5 MG tablet, TAKE 1 TABLET (5 MG TOTAL) BY MOUTH DAILY., Disp: 90 tablet, Rfl: 3 .  fluticasone (FLONASE) 50 MCG/ACT nasal spray, Place 1 spray into both nostrils daily., Disp: 16 g, Rfl:  2  Social History   Tobacco Use  Smoking Status Never Smoker  Smokeless Tobacco Never Used    No Known Allergies Objective:  There were no vitals filed for this visit. There is no height or weight on file to calculate BMI. Constitutional Well developed. Well nourished.  Vascular Dorsalis pedis pulses palpable bilaterally. Posterior tibial pulses palpable bilaterally. Capillary refill normal to all digits.  No cyanosis or clubbing noted. Pedal hair growth normal.  Neurologic Normal speech. Oriented to person, place, and time. Epicritic sensation to light touch grossly present bilaterally.  Dermatologic Nails well groomed and normal in appearance. No open wounds. No skin lesions.  Orthopedic:  Pain on palpation to the right dorsal lateral midfoot.  No pain with resisted dorsiflexion of the digits.  No pain at the course of the plantar fascial.  No concern for extensor or flexor tendinitis.  Lisfranc interval intact   Radiographs: None Assessment:   1. Capsulitis of right foot   2. Arthritis of midfoot    Plan:  Patient was evaluated and treated and all questions answered.  Right dorsal lateral midfoot arthritis with underlying capsulitis -I explained to the patient the etiology of arthritis and various treatment options were extensively discussed.  Given the amount of pain she is having she will benefit from a steroid injection of decrease acute inflammatory component associate with pain.  Patient agrees with the plan like to proceed with a steroid injection. -A steroid injection was performed at right dorsal lateral midfoot using 1% plain Lidocaine and 10 mg of Kenalog. This was well tolerated. -If there is no improvement we will plan on doing an MRI versus cam boot immobilization  No follow-ups on file.

## 2021-04-09 ENCOUNTER — Ambulatory Visit (INDEPENDENT_AMBULATORY_CARE_PROVIDER_SITE_OTHER): Payer: 59 | Admitting: Podiatry

## 2021-04-09 ENCOUNTER — Other Ambulatory Visit: Payer: Self-pay

## 2021-04-09 DIAGNOSIS — M778 Other enthesopathies, not elsewhere classified: Secondary | ICD-10-CM | POA: Diagnosis not present

## 2021-04-14 ENCOUNTER — Encounter: Payer: Self-pay | Admitting: Podiatry

## 2021-04-14 NOTE — Progress Notes (Signed)
  Subjective:  Patient ID: Samantha Barr, female    DOB: [birth year],  MRN: [id number]  Chief Complaint  Patient presents with   Foot Pain    Right foot pain  PT stated that her foot is doing a little better     40 y.o. female presents with the above complaint.  Patient presents with a follow-up of right dorsal midfoot pain.  She states that her pain has gotten little bit better with the steroid shot but is still there.  She states that she has been on her foot.  She has been working she denies any other acute complaint she does not want a cam boot immobilization.   Review of Systems: Negative except as noted in the HPI. Denies N/V/F/Ch.  Past Medical History:  Diagnosis Date   Hypertension    Renal disorder     Current Outpatient Medications:    amLODipine (NORVASC) 5 MG tablet, TAKE 1 TABLET (5 MG TOTAL) BY MOUTH DAILY., Disp: 90 tablet, Rfl: 3   Cholecalciferol (VITAMIN D3) 125 MCG (5000 UT) CAPS, 1 (ONE) CAPSULE BY MOUTH DAILY, Disp: 90 capsule, Rfl: 2   ferrous gluconate (FERGON) 324 MG tablet, TAKE 1 TABLET (324 MG TOTAL) BY MOUTH DAILY WITH BREAKFAST., Disp: 90 tablet, Rfl: 3   fluticasone (FLONASE) 50 MCG/ACT nasal spray, Place 1 spray into both nostrils daily., Disp: 16 g, Rfl: 2   losartan (COZAAR) 100 MG tablet, TAKE 1 TABLET (100 MG TOTAL) BY MOUTH DAILY., Disp: 90 tablet, Rfl: 1   rosuvastatin (CRESTOR) 20 MG tablet, Take 1 tablet (20 mg total) by mouth daily., Disp: 90 tablet, Rfl: 1   Vitamin D, Ergocalciferol, (DRISDOL) 1.25 MG (50000 UNIT) CAPS capsule, TAKE 1 CAPSULE (50,000 UNITS TOTAL) BY MOUTH EVERY 7 (SEVEN) DAYS., Disp: 12 capsule, Rfl: 1  Social History   Tobacco Use  Smoking Status Never  Smokeless Tobacco Never    No Known Allergies Objective:  There were no vitals filed for this visit. There is no height or weight on file to calculate BMI. Constitutional Well developed. Well nourished.  Vascular Dorsalis pedis pulses palpable  bilaterally. Posterior tibial pulses palpable bilaterally. Capillary refill normal to all digits.  No cyanosis or clubbing noted. Pedal hair growth normal.  Neurologic Normal speech. Oriented to person, place, and time. Epicritic sensation to light touch grossly present bilaterally.  Dermatologic Nails well groomed and normal in appearance. No open wounds. No skin lesions.  Orthopedic: No pain on palpation to the right dorsal lateral midfoot.  Now there is mild pain with resisted dorsiflexion of the digits.  No pain at the course of the plantar fascial.  No concern for extensor or flexor tendinitis.  Lisfranc interval intact   Radiographs: None Assessment:   1. Extensor tendinitis of foot     Plan:  Patient was evaluated and treated and all questions answered.  Right extensor tendinitis -Patient etiology of extensor tendinitis of the treatment options were discussed.  Clinically she did not do well with a steroid injection she did have some reduction in pain but she still has pain present. -She would like to discuss it presently also could be done.  I believe she will benefit from a Tri-Lock ankle brace to give stability and allow the extensor tendon to heal appropriately.  She does not want to do cam boot immobilization at this time.  No follow-ups on file.

## 2021-04-26 ENCOUNTER — Other Ambulatory Visit: Payer: Self-pay

## 2021-04-26 ENCOUNTER — Ambulatory Visit
Admission: RE | Admit: 2021-04-26 | Discharge: 2021-04-26 | Disposition: A | Discharge status: Home / Self Care | Payer: 59 | Source: Ambulatory Visit | Attending: Nurse Practitioner | Admitting: Nurse Practitioner

## 2021-04-26 DIAGNOSIS — Z1231 Encounter for screening mammogram for malignant neoplasm of breast: Secondary | ICD-10-CM

## 2021-05-04 ENCOUNTER — Ambulatory Visit: Payer: 59 | Admitting: Nurse Practitioner

## 2021-05-05 ENCOUNTER — Ambulatory Visit (INDEPENDENT_AMBULATORY_CARE_PROVIDER_SITE_OTHER): Payer: 59 | Admitting: Podiatry

## 2021-05-05 ENCOUNTER — Other Ambulatory Visit: Payer: Self-pay

## 2021-05-05 DIAGNOSIS — M778 Other enthesopathies, not elsewhere classified: Secondary | ICD-10-CM | POA: Diagnosis not present

## 2021-05-07 ENCOUNTER — Encounter: Payer: Self-pay | Admitting: Podiatry

## 2021-05-07 ENCOUNTER — Other Ambulatory Visit: Payer: Self-pay

## 2021-05-07 ENCOUNTER — Ambulatory Visit: Payer: 59 | Attending: Nurse Practitioner | Admitting: Nurse Practitioner

## 2021-05-07 ENCOUNTER — Other Ambulatory Visit (HOSPITAL_COMMUNITY)
Admission: RE | Admit: 2021-05-07 | Discharge: 2021-05-07 | Disposition: A | Discharge status: Home / Self Care | Payer: 59 | Source: Ambulatory Visit | Attending: Nurse Practitioner | Admitting: Nurse Practitioner

## 2021-05-07 ENCOUNTER — Encounter: Payer: Self-pay | Admitting: Nurse Practitioner

## 2021-05-07 VITALS — BP 117/81 | HR 94 | Ht 66.0 in | Wt 157.6 lb

## 2021-05-07 DIAGNOSIS — Z124 Encounter for screening for malignant neoplasm of cervix: Secondary | ICD-10-CM | POA: Insufficient documentation

## 2021-05-07 NOTE — Progress Notes (Signed)
Assessment & Plan:  Samantha Barr was seen today for gynecologic exam.  Diagnoses and all orders for this visit:  Encounter for Papanicolaou smear for cervical cancer screening -     Cytology - PAP   Patient has been counseled on age-appropriate routine health concerns for screening and prevention. These are reviewed and up-to-date. Referrals have been placed accordingly. Immunizations are up-to-date or declined.    Subjective:   Chief Complaint  Patient presents with   Gynecologic Exam   Gynecologic Exam Pertinent negatives include no abdominal pain, chills, fever, flank pain or rash.  Samantha Barr 40 y.o. female presents to office today for PAP smear.   Mammogram results were discussed today.   Review of Systems  Constitutional: Negative.  Negative for chills, fever, malaise/fatigue and weight loss.  Respiratory: Negative.  Negative for cough, shortness of breath and wheezing.   Cardiovascular: Negative.  Negative for chest pain, orthopnea and leg swelling.  Gastrointestinal:  Negative for abdominal pain.  Genitourinary: Negative.  Negative for flank pain.  Skin: Negative.  Negative for rash.  Psychiatric/Behavioral:  Negative for suicidal ideas.    Past Medical History:  Diagnosis Date   Hypertension    Renal disorder     No past surgical history on file.  Family History  Problem Relation Age of Onset   Breast cancer Father    Hypertension Maternal Aunt     Social History Reviewed with no changes to be made today.   Outpatient Medications Prior to Visit  Medication Sig Dispense Refill   losartan (COZAAR) 100 MG tablet TAKE 1 TABLET (100 MG TOTAL) BY MOUTH DAILY. 90 tablet 1   rosuvastatin (CRESTOR) 20 MG tablet Take 1 tablet (20 mg total) by mouth daily. 90 tablet 1   amLODipine (NORVASC) 5 MG tablet TAKE 1 TABLET (5 MG TOTAL) BY MOUTH DAILY. 90 tablet 3   Cholecalciferol (VITAMIN D3) 125 MCG (5000 UT) CAPS 1 (ONE) CAPSULE BY MOUTH DAILY (Patient  not taking: Reported on 05/07/2021) 90 capsule 2   ferrous gluconate (FERGON) 324 MG tablet TAKE 1 TABLET (324 MG TOTAL) BY MOUTH DAILY WITH BREAKFAST. (Patient not taking: Reported on 05/07/2021) 90 tablet 3   fluticasone (FLONASE) 50 MCG/ACT nasal spray Place 1 spray into both nostrils daily. 16 g 2   Vitamin D, Ergocalciferol, (DRISDOL) 1.25 MG (50000 UNIT) CAPS capsule TAKE 1 CAPSULE (50,000 UNITS TOTAL) BY MOUTH EVERY 7 (SEVEN) DAYS. (Patient not taking: Reported on 05/07/2021) 12 capsule 1   No facility-administered medications prior to visit.    No Known Allergies     Objective:    BP 117/81   Pulse 94   Ht 5\' 6"  (1.676 m)   Wt 157 lb 9.6 oz (71.5 kg)   SpO2 99%   BMI 25.44 kg/m  Wt Readings from Last 3 Encounters:  05/07/21 157 lb 9.6 oz (71.5 kg)  02/22/21 156 lb (70.8 kg)  08/24/20 156 lb 3.2 oz (70.9 kg)    Physical Exam Exam conducted with a chaperone present.  Constitutional:      Appearance: She is well-developed.  HENT:     Head: Normocephalic.  Cardiovascular:     Rate and Rhythm: Normal rate and regular rhythm.     Heart sounds: Normal heart sounds.  Pulmonary:     Effort: Pulmonary effort is normal.     Breath sounds: Normal breath sounds.  Abdominal:     General: Bowel sounds are normal.     Palpations: Abdomen is  soft.     Hernia: There is no hernia in the left inguinal area.  Genitourinary:    Exam position: Lithotomy position.     Labia:        Right: No rash, tenderness, lesion or injury.        Left: No rash, tenderness, lesion or injury.      Vagina: Normal. No signs of injury and foreign body. No vaginal discharge, erythema, tenderness or bleeding.     Cervix: No cervical motion tenderness or friability.     Uterus: Not deviated and not enlarged.      Adnexa:        Right: No mass, tenderness or fullness.         Left: No mass, tenderness or fullness.       Rectum: Normal. No external hemorrhoid.  Lymphadenopathy:     Lower Body: No right  inguinal adenopathy. No left inguinal adenopathy.  Skin:    General: Skin is warm and dry.  Neurological:     Mental Status: She is alert and oriented to person, place, and time.  Psychiatric:        Behavior: Behavior normal.        Thought Content: Thought content normal.        Judgment: Judgment normal.         Patient has been counseled extensively about nutrition and exercise as well as the importance of adherence with medications and regular follow-up. The patient was given clear instructions to go to ER or return to medical center if symptoms don't improve, worsen or new problems develop. The patient verbalized understanding.   Follow-up: Return in about 3 months (around 08/07/2021) for HTN.   Gildardo Pounds, FNP-BC [facility] and [facility], [place]   05/07/2021, 10:53 AM

## 2021-05-07 NOTE — Progress Notes (Signed)
  Subjective:  Patient ID: Samantha Barr, female    DOB: 1981-05-15,  MRN: [id number]  Chief Complaint  Patient presents with   Foot Pain    PT stated that she is doing well she denies pain at this time     40 y.o. female presents with the above complaint.  Patient presents with follow-up to right dorsal extensor tendinitis.  She states she is doing a lot better.  The brace helps considerably.  She denies any other acute complaints.   Review of Systems: Negative except as noted in the HPI. Denies N/V/F/Ch.  Past Medical History:  Diagnosis Date   Hypertension    Renal disorder     Current Outpatient Medications:    amLODipine (NORVASC) 5 MG tablet, TAKE 1 TABLET (5 MG TOTAL) BY MOUTH DAILY., Disp: 90 tablet, Rfl: 3   Cholecalciferol (VITAMIN D3) 125 MCG (5000 UT) CAPS, 1 (ONE) CAPSULE BY MOUTH DAILY, Disp: 90 capsule, Rfl: 2   ferrous gluconate (FERGON) 324 MG tablet, TAKE 1 TABLET (324 MG TOTAL) BY MOUTH DAILY WITH BREAKFAST., Disp: 90 tablet, Rfl: 3   fluticasone (FLONASE) 50 MCG/ACT nasal spray, Place 1 spray into both nostrils daily., Disp: 16 g, Rfl: 2   losartan (COZAAR) 100 MG tablet, TAKE 1 TABLET (100 MG TOTAL) BY MOUTH DAILY., Disp: 90 tablet, Rfl: 1   rosuvastatin (CRESTOR) 20 MG tablet, Take 1 tablet (20 mg total) by mouth daily., Disp: 90 tablet, Rfl: 1   Vitamin D, Ergocalciferol, (DRISDOL) 1.25 MG (50000 UNIT) CAPS capsule, TAKE 1 CAPSULE (50,000 UNITS TOTAL) BY MOUTH EVERY 7 (SEVEN) DAYS., Disp: 12 capsule, Rfl: 1  Social History   Tobacco Use  Smoking Status Never  Smokeless Tobacco Never    No Known Allergies Objective:  There were no vitals filed for this visit. There is no height or weight on file to calculate BMI. Constitutional Well developed. Well nourished.  Vascular Dorsalis pedis pulses palpable bilaterally. Posterior tibial pulses palpable bilaterally. Capillary refill normal to all digits.  No cyanosis or clubbing noted. Pedal  hair growth normal.  Neurologic Normal speech. Oriented to person, place, and time. Epicritic sensation to light touch grossly present bilaterally.  Dermatologic Nails well groomed and normal in appearance. No open wounds. No skin lesions.  Orthopedic: No pain on palpation to the right dorsal lateral midfoot.  No further  pain with resisted dorsiflexion of the digits.  No pain at the course of the plantar fascial.  No concern for extensor or flexor tendinitis.  Lisfranc interval intact   Radiographs: None Assessment:   1. Extensor tendinitis of foot      Plan:  Patient was evaluated and treated and all questions answered.  Right extensor tendinitis -Clinically healed with cam boot immobilization and Tri-Lock ankle brace.  I discussed with the location if it flares up she can continue utilizing the brace to give her stability.  She can start transitioning out of the brace and if any foot and ankle issues come see me right away.  She states understanding.  She denies any other acute issues.  No follow-ups on file.

## 2021-05-17 LAB — CYTOLOGY - PAP
Comment: NEGATIVE
Diagnosis: NEGATIVE
High risk HPV: NEGATIVE

## 2021-05-24 ENCOUNTER — Other Ambulatory Visit: Payer: Self-pay

## 2021-05-24 ENCOUNTER — Emergency Department (HOSPITAL_COMMUNITY): Payer: 59

## 2021-05-24 ENCOUNTER — Emergency Department (HOSPITAL_COMMUNITY)
Admission: EM | Admit: 2021-05-24 | Discharge: 2021-05-24 | Disposition: A | Discharge status: Home / Self Care | Payer: 59 | Attending: Emergency Medicine | Admitting: Emergency Medicine

## 2021-05-24 ENCOUNTER — Ambulatory Visit: Payer: Self-pay | Admitting: *Deleted

## 2021-05-24 DIAGNOSIS — Z79899 Other long term (current) drug therapy: Secondary | ICD-10-CM | POA: Insufficient documentation

## 2021-05-24 DIAGNOSIS — N183 Chronic kidney disease, stage 3 unspecified: Secondary | ICD-10-CM | POA: Insufficient documentation

## 2021-05-24 DIAGNOSIS — R110 Nausea: Secondary | ICD-10-CM | POA: Diagnosis not present

## 2021-05-24 DIAGNOSIS — R1031 Right lower quadrant pain: Secondary | ICD-10-CM | POA: Diagnosis present

## 2021-05-24 DIAGNOSIS — I129 Hypertensive chronic kidney disease with stage 1 through stage 4 chronic kidney disease, or unspecified chronic kidney disease: Secondary | ICD-10-CM | POA: Insufficient documentation

## 2021-05-24 DIAGNOSIS — N9489 Other specified conditions associated with female genital organs and menstrual cycle: Secondary | ICD-10-CM | POA: Diagnosis not present

## 2021-05-24 DIAGNOSIS — R1013 Epigastric pain: Secondary | ICD-10-CM | POA: Insufficient documentation

## 2021-05-24 LAB — CBC WITH DIFFERENTIAL/PLATELET
Abs Immature Granulocytes: 0.02 10*3/uL (ref 0.00–0.07)
Basophils Absolute: 0 10*3/uL (ref 0.0–0.1)
Basophils Relative: 0 %
Eosinophils Absolute: 0.1 10*3/uL (ref 0.0–0.5)
Eosinophils Relative: 1 %
HCT: 40.9 % (ref 36.0–46.0)
Hemoglobin: 13.4 g/dL (ref 12.0–15.0)
Immature Granulocytes: 0 %
Lymphocytes Relative: 10 %
Lymphs Abs: 1 10*3/uL (ref 0.7–4.0)
MCH: 27.3 pg (ref 26.0–34.0)
MCHC: 32.8 g/dL (ref 30.0–36.0)
MCV: 83.3 fL (ref 80.0–100.0)
Monocytes Absolute: 0.4 10*3/uL (ref 0.1–1.0)
Monocytes Relative: 4 %
Neutro Abs: 8.7 10*3/uL — ABNORMAL HIGH (ref 1.7–7.7)
Neutrophils Relative %: 85 %
Platelets: 217 10*3/uL (ref 150–400)
RBC: 4.91 MIL/uL (ref 3.87–5.11)
RDW: 14.2 % (ref 11.5–15.5)
WBC: 10.3 10*3/uL (ref 4.0–10.5)
nRBC: 0 % (ref 0.0–0.2)

## 2021-05-24 LAB — LIPASE, BLOOD: Lipase: 34 U/L (ref 11–51)

## 2021-05-24 LAB — COMPREHENSIVE METABOLIC PANEL
ALT: 11 U/L (ref 0–44)
AST: 15 U/L (ref 15–41)
Albumin: 3.7 g/dL (ref 3.5–5.0)
Alkaline Phosphatase: 57 U/L (ref 38–126)
Anion gap: 6 (ref 5–15)
BUN: 28 mg/dL — ABNORMAL HIGH (ref 6–20)
CO2: 23 mmol/L (ref 22–32)
Calcium: 9.3 mg/dL (ref 8.9–10.3)
Chloride: 110 mmol/L (ref 98–111)
Creatinine, Ser: 1.81 mg/dL — ABNORMAL HIGH (ref 0.44–1.00)
GFR, Estimated: 35 mL/min — ABNORMAL LOW (ref >60)
Glucose, Bld: 96 mg/dL (ref 70–99)
Potassium: 5.2 mmol/L — ABNORMAL HIGH (ref 3.5–5.1)
Sodium: 139 mmol/L (ref 135–145)
Total Bilirubin: 0.6 mg/dL (ref 0.3–1.2)
Total Protein: 7.5 g/dL (ref 6.5–8.1)

## 2021-05-24 LAB — URINALYSIS, ROUTINE W REFLEX MICROSCOPIC
Bacteria, UA: NONE SEEN
Bilirubin Urine: NEGATIVE
Glucose, UA: NEGATIVE mg/dL
Hgb urine dipstick: NEGATIVE
Ketones, ur: NEGATIVE mg/dL
Leukocytes,Ua: NEGATIVE
Nitrite: NEGATIVE
Protein, ur: 100 mg/dL — AB
Specific Gravity, Urine: 1.014 (ref 1.005–1.030)
pH: 5 (ref 5.0–8.0)

## 2021-05-24 LAB — HCG, QUANTITATIVE, PREGNANCY: hCG, Beta Chain, Quant, S: <1 m[IU]/mL (ref <5)

## 2021-05-24 MED ORDER — SODIUM CHLORIDE 0.9 % IV BOLUS
1000.0000 mL | Freq: Once | INTRAVENOUS | Status: AC
  Administered 2021-05-24: 1000 mL via INTRAVENOUS

## 2021-05-24 MED ORDER — PANTOPRAZOLE SODIUM 20 MG PO TBEC: 20.0000 mg | DELAYED_RELEASE_TABLET | Freq: Every day | ORAL | 0 refills | Status: DC

## 2021-05-24 MED ORDER — ONDANSETRON HCL 4 MG/2ML IJ SOLN
4.0000 mg | Freq: Once | INTRAMUSCULAR | Status: AC
  Administered 2021-05-24: 4 mg via INTRAVENOUS
  Filled 2021-05-24: qty 2

## 2021-05-24 MED ORDER — HYDROMORPHONE HCL 1 MG/ML IJ SOLN
1.0000 mg | Freq: Once | INTRAMUSCULAR | Status: AC
  Administered 2021-05-24: 1 mg via INTRAVENOUS
  Filled 2021-05-24: qty 1

## 2021-05-24 MED ORDER — IOHEXOL 350 MG/ML SOLN
60.0000 mL | Freq: Once | INTRAVENOUS | Status: AC | PRN
  Administered 2021-05-24: 60 mL via INTRAVENOUS

## 2021-05-24 NOTE — Telephone Encounter (Signed)
Very poor connection with patient's line. All over abdominal pain that is constant-started 2 weeks ago and is worsening lately. Food and drink increases pain. LBM?(Could not understand) No difficulty voiding and no fever.No pcp office availability-referred to [facility] office for appointment.   Reason for Disposition  [1] MODERATE pain (e.g., interferes with normal activities) AND [2] comes and goes (cramps) AND [3] present > 24 hours  (Exception: pain with Vomiting or Diarrhea - see that Guideline)  Answer Assessment - Initial Assessment Questions 1. LOCATION: "Where does it hurt?"  All over stomach 2. RADIATION: "Does the pain shoot anywhere else?" (e.g., chest, back)     no 3. ONSET: "When did the pain begin?" (e.g., minutes, hours or days ago)      About 2 weeks ago but now getting worse 4. SUDDEN: "Gradual or sudden onset?"     constant 5. PATTERN "Does the pain come and go, or is it constant?"    - If constant: "Is it getting better, staying the same, or worsening?"      (Note: Constant means the pain never goes away completely; most serious pain is constant and it progresses)     - If intermittent: "How long does it last?" "Do you have pain now?"     (Note: Intermittent means the pain goes away completely between bouts)      6. SEVERITY: "How bad is the pain?"  (e.g., Scale 1-10; mild, moderate, or severe)    - MILD (1-3): doesn't interfere with normal activities, abdomen soft and not tender to touch     - MODERATE (4-7): interferes with normal activities or awakens from sleep, abdomen tender to touch     - SEVERE (8-10): excruciating pain, doubled over, unable to do any normal activities       9 7. RECURRENT SYMPTOM: "Have you ever had this type of stomach pain before?" If Yes, ask: "When was the last time?" and "What happened that time?"      no 8. AGGRAVATING FACTORS: "Does anything seem to cause this pain?" (e.g., foods, stress, alcohol)     Food and drink 9. CARDIAC  SYMPTOMS: "Do you have any of the following symptoms: chest pain, difficulty breathing, sweating, nausea?"     no 10. OTHER SYMPTOMS: "Do you have any other symptoms?" (e.g., back pain, diarrhea, fever, urination pain, vomiting)       None that I could gather 11. PREGNANCY: "Is there any chance you are pregnant?" "When was your last menstrual period?"       Did not ask  Protocols used: Abdominal Pain - Upper-A-AH

## 2021-05-24 NOTE — Discharge Instructions (Addendum)
Follow up with your doctor with in one week

## 2021-05-24 NOTE — ED Triage Notes (Signed)
Pt reports that she has had diffuse abd pain more localized in RUQ/RLQ pt reports nausea but no vomiting and rates pain 9/10 at this time.

## 2021-05-24 NOTE — ED Provider Notes (Signed)
Emergency Medicine Provider Triage Evaluation Note  Samantha Barr , a 40 y.o. female  was evaluated in triage.  Pt complains of abdominal pain, nausea that has been ongoing for the past 2 weeks.  No prior similar episodes like this, last bowel movement was this morning, no urinary symptoms.  It is exacerbated with food, has been taking herbal teas without improvement.  Pressure of 99 on arrival with a heart rate of 100.  Rates her pain very severe 9 out of 10.  Review of Systems  Positive: Abdominal pain, nausea Negative: Fever, vomiting, dysuria  Physical Exam  BP 117/84 (BP Location: Right Arm)   Pulse 100   Temp 99.8 F (37.7 C) (Oral)   Resp 16   SpO2 97%  Gen:   Awake, no distress  Resp:  Normal effort  MSK:   Moves extremities without difficulty  Other:    Medical Decision Making  Medically screening exam initiated at 1:48 PM.  Appropriate orders placed.  Samantha Barr was informed that the remainder of the evaluation will be completed by another provider, this initial triage assessment does not replace that evaluation, and the importance of remaining in the ED until their evaluation is complete.  Patient here with generalized abdominal pain, more so right upper and right lower quadrant along with some nausea.  Exacerbated by eating.  No prior surgical history, last menstrual period in the month of 05/07/23.  Husband is deceased, no sexual activity denies any chance of pregnancy.   [name], PA-C 05/24/21 1349    Varney Biles, MD 05/24/21 1529

## 2021-05-24 NOTE — ED Provider Notes (Signed)
[facility] DEPT Provider Note   CSN: [id number] Arrival date & time: 05/24/21  1238     History Chief Complaint  Patient presents with   Abdominal Pain    Samantha Barr is a 40 y.o. female.  She states that about 4 years ago while living in her home country, [place], she had a similar issue.  She was evaluated in the hospital and observed for several hours.  She cannot remember the diagnosis.  She denies any abdominal surgeries.  She states that the pain was there about 2 weeks ago but has dramatically increased over the past day.  The history is provided by the patient.  Abdominal Pain Pain location:  RLQ Pain quality: sharp   Pain radiates to:  Epigastric region Pain severity:  Severe Onset quality:  Gradual Duration:  2 weeks Timing:  Constant Progression:  Worsening Chronicity:  New Context comment:  Unknown cause Relieved by:  Nothing Worsened by:  Eating Ineffective treatments: herbal tea. Associated symptoms: nausea   Associated symptoms: no chest pain, no chills, no constipation, no cough, no diarrhea, no dysuria, no fever, no hematuria, no shortness of breath, no sore throat, no vaginal bleeding (missed her period this month) and no vomiting       Past Medical History:  Diagnosis Date   Hypertension    Renal disorder     Patient Active Problem List   Diagnosis Date Noted   Essential hypertension 03/05/2018   Stage 3 chronic kidney disease ([place]) 03/05/2018    No past surgical history on file.   OB History   No obstetric history on file.     Family History  Problem Relation Age of Onset   Breast cancer Father    Hypertension Maternal Aunt     Social History   Tobacco Use   Smoking status: Never   Smokeless tobacco: Never  Vaping Use   Vaping Use: Never used  Substance Use Topics   Alcohol use: No   Drug use: No    Home Medications Prior to Admission medications   Medication Sig Start Date  End Date Taking? Authorizing Provider  amLODipine (NORVASC) 5 MG tablet TAKE 1 TABLET (5 MG TOTAL) BY MOUTH DAILY. 03/04/20 03/04/21  Gildardo Pounds, NP  Cholecalciferol (VITAMIN D3) 125 MCG (5000 UT) CAPS 1 (ONE) CAPSULE BY MOUTH DAILY Patient not taking: Reported on 05/07/2021 12/04/20 12/04/21  Elmarie Shiley, MD  ferrous gluconate (FERGON) 324 MG tablet TAKE 1 TABLET (324 MG TOTAL) BY MOUTH DAILY WITH BREAKFAST. Patient not taking: Reported on 05/07/2021 11/10/20 11/10/21  Gildardo Pounds, NP  fluticasone [facility]) 50 MCG/ACT nasal spray Place 1 spray into both nostrils daily. 06/13/18 07/13/18  Gildardo Pounds, NP  losartan (COZAAR) 100 MG tablet TAKE 1 TABLET (100 MG TOTAL) BY MOUTH DAILY. 08/08/20 08/08/21  Gildardo Pounds, NP  rosuvastatin (CRESTOR) 20 MG tablet Take 1 tablet (20 mg total) by mouth daily. 02/24/21 05/25/21  Gildardo Pounds, NP  Vitamin D, Ergocalciferol, (DRISDOL) 1.25 MG (50000 UNIT) CAPS capsule TAKE 1 CAPSULE (50,000 UNITS TOTAL) BY MOUTH EVERY 7 (SEVEN) DAYS. Patient not taking: Reported on 05/07/2021 02/24/21 02/24/22  Gildardo Pounds, NP    Allergies    Patient has no known allergies.  Review of Systems   Review of Systems  Constitutional:  Negative for chills and fever.  HENT:  Negative for ear pain and sore throat.   Eyes:  Negative for pain and visual disturbance.  Respiratory:  Negative for cough and shortness of breath.   Cardiovascular:  Negative for chest pain and palpitations.  Gastrointestinal:  Positive for abdominal pain and nausea. Negative for constipation, diarrhea and vomiting.  Genitourinary:  Negative for dysuria, hematuria and vaginal bleeding (missed her period this month).  Musculoskeletal:  Negative for arthralgias and back pain.  Skin:  Negative for color change and rash.  Neurological:  Negative for seizures and syncope.  All other systems reviewed and are negative.  Physical Exam Updated Vital Signs BP (!) 119/97   Pulse 88   Temp 99.8 F (37.7 C)  (Oral)   Resp 18   Ht '5\' 6"'$  (1.676 m)   Wt 75.3 kg   LMP 03/31/2021   SpO2 100%   BMI 26.79 kg/m   Physical Exam Vitals and nursing note reviewed.  Constitutional:      Appearance: She is diaphoretic.     Comments: Appears to be in pain  HENT:     Head: Normocephalic and atraumatic.  Cardiovascular:     Rate and Rhythm: Normal rate and regular rhythm.     Heart sounds: Normal heart sounds.  Pulmonary:     Effort: Pulmonary effort is normal. No tachypnea.     Breath sounds: Normal breath sounds.  Abdominal:     Palpations: Abdomen is soft.     Tenderness: There is abdominal tenderness in the right lower quadrant. There is guarding.  Musculoskeletal:     Right lower leg: No edema.     Left lower leg: No edema.  Skin:    General: Skin is warm.  Neurological:     General: No focal deficit present.     Mental Status: She is alert and oriented to person, place, and time.  Psychiatric:        Mood and Affect: Mood normal.        Behavior: Behavior normal.    ED Results / Procedures / Treatments   Labs (all labs ordered are listed, but only abnormal results are displayed) Labs Reviewed  CBC WITH DIFFERENTIAL/PLATELET - Abnormal; Notable for the following components:      Result Value   Neutro Abs 8.7 (*)    All other components within normal limits  COMPREHENSIVE METABOLIC PANEL - Abnormal; Notable for the following components:   Potassium 5.2 (*)    BUN 28 (*)    Creatinine, Ser 1.81 (*)    GFR, Estimated 35 (*)    All other components within normal limits  URINALYSIS, ROUTINE W REFLEX MICROSCOPIC - Abnormal; Notable for the following components:   Color, Urine STRAW (*)    Protein, ur 100 (*)    All other components within normal limits  LIPASE, BLOOD  HCG, QUANTITATIVE, PREGNANCY    EKG None  Radiology DG Chest 2 View  Result Date: 05/24/2021 CLINICAL DATA:  Abdominal pain and nausea for 2 weeks.  Weakness. EXAM: CHEST - 2 VIEW COMPARISON:  07/01/2020  FINDINGS: The heart size and mediastinal contours are within normal limits. Both lungs are clear. The visualized skeletal structures are unremarkable. IMPRESSION: No active cardiopulmonary disease. Electronically Signed   By: Marlaine Hind M.D.   On: 05/24/2021 17:32   CT ABDOMEN PELVIS W CONTRAST  Result Date: 05/24/2021 CLINICAL DATA:  Right-sided abdominal pain EXAM: CT ABDOMEN AND PELVIS WITH CONTRAST TECHNIQUE: Multidetector CT imaging of the abdomen and pelvis was performed using the standard protocol following bolus administration of intravenous contrast. CONTRAST:  65m OMNIPAQUE IOHEXOL 350 MG/ML SOLN  COMPARISON:  None. FINDINGS: Lower chest: There are multiple small mixed solid and ground-glass nodular opacities in the included bilateral lung bases, largest in the right lower lobe measuring 1.0 cm (series 6, image 9). Hepatobiliary: No solid liver abnormality is seen. Small gallstone in the gallbladder fundus. No gallbladder wall thickening, or biliary dilatation. Pancreas: Unremarkable. No pancreatic ductal dilatation or surrounding inflammatory changes. Spleen: Normal in size without significant abnormality. Adrenals/Urinary Tract: Adrenal glands are unremarkable. Kidneys are normal, without renal calculi, solid lesion, or hydronephrosis. Bladder is unremarkable. Stomach/Bowel: Stomach is within normal limits. Appendix appears normal. No evidence of bowel wall thickening, distention, or inflammatory changes. Vascular/Lymphatic: No significant vascular findings are present. No enlarged abdominal or pelvic lymph nodes. Reproductive: No mass or other significant abnormality. Bilateral ovarian cysts and follicles. Other: No abdominal wall hernia or abnormality. Small volume free fluid in the low pelvis. Musculoskeletal: No acute or significant osseous findings. IMPRESSION: 1. No acute CT findings of the abdomen or pelvis to explain right-sided abdominal pain. Normal appendix. 2. Cholelithiasis without  evidence of cholecystitis. 3. There are multiple small mixed solid and ground-glass nodular opacities in the included bilateral lung bases, largest in the right lower lobe measuring 1.0 cm. These are nonspecific and may be infectious or inflammatory, including atypical infection. Consider dedicated imaging of the chest for further evaluation. 4. Small volume nonspecific free fluid in the low pelvis, likely functional in the reproductive age setting. Electronically Signed   By: Eddie Candle M.D.   On: 05/24/2021 16:07    Procedures Procedures   Medications Ordered in ED Medications - No data to display  ED Course  I have reviewed the triage vital signs and the nursing notes.  Pertinent labs & imaging results that were available during my care of the patient were reviewed by me and considered in my medical decision making (see chart for details).    MDM Rules/Calculators/A&P                           Towana Badger [facility] presents with abdominal pain.  I am concerned about peritonitis.  Ruptured appendix, ovarian torsion, ruptured hemorrhagic ovarian cyst, or other intra-abdominal pathology are all in the differential diagnosis.  CT scan will be followed. Final Clinical Impression(s) / ED Diagnoses Final diagnoses:  Right lower quadrant abdominal pain    Rx / DC Orders ED Discharge Orders     None        Arnaldo Natal, MD 05/25/21 [phone]

## 2021-05-25 ENCOUNTER — Ambulatory Visit: Payer: 59 | Admitting: Physician Assistant

## 2021-05-25 NOTE — Telephone Encounter (Signed)
Patient states she was seen in the ED on yesterday. Has a f/u appt with PCP in August.

## 2021-06-08 ENCOUNTER — Encounter: Payer: Self-pay | Admitting: Nurse Practitioner

## 2021-06-09 ENCOUNTER — Encounter: Payer: Self-pay | Admitting: Nurse Practitioner

## 2021-06-09 ENCOUNTER — Ambulatory Visit: Payer: 59 | Attending: Nurse Practitioner | Admitting: Nurse Practitioner

## 2021-06-09 ENCOUNTER — Other Ambulatory Visit: Payer: Self-pay

## 2021-06-09 DIAGNOSIS — R911 Solitary pulmonary nodule: Secondary | ICD-10-CM | POA: Diagnosis not present

## 2021-06-09 DIAGNOSIS — I10 Essential (primary) hypertension: Secondary | ICD-10-CM

## 2021-06-09 DIAGNOSIS — K219 Gastro-esophageal reflux disease without esophagitis: Secondary | ICD-10-CM

## 2021-06-09 DIAGNOSIS — Z09 Encounter for follow-up examination after completed treatment for conditions other than malignant neoplasm: Secondary | ICD-10-CM

## 2021-06-09 MED ORDER — AMLODIPINE BESYLATE 5 MG PO TABS
ORAL_TABLET | Freq: Every day | ORAL | 3 refills | Status: DC
  Filled 2021-06-09 – 2022-01-06 (×3): qty 90, 90d supply, fill #0

## 2021-06-09 MED ORDER — LOSARTAN POTASSIUM 100 MG PO TABS
ORAL_TABLET | Freq: Every day | ORAL | 1 refills | Status: DC
  Filled 2021-06-09 – 2022-01-06 (×3): qty 90, 90d supply, fill #0

## 2021-06-09 MED ORDER — PANTOPRAZOLE SODIUM 20 MG PO TBEC
20.0000 mg | DELAYED_RELEASE_TABLET | Freq: Every day | ORAL | 0 refills | Status: DC
  Filled 2021-06-09 – 2021-06-25 (×2): qty 90, 90d supply, fill #0

## 2021-06-09 NOTE — Progress Notes (Signed)
Virtual Visit via Telephone Note Due to national recommendations of social distancing due to Gem 19, telehealth visit is felt to be most appropriate for this patient at this time.  I discussed the limitations, risks, security and privacy concerns of performing an evaluation and management service by telephone and the availability of in person appointments. I also discussed with the patient that there may be a patient responsible charge related to this service. The patient expressed understanding and agreed to proceed.    I connected with Samantha Barr on 06/09/21  at   2:30 PM EDT  EDT by telephone and verified that I am speaking with the correct person using two identifiers.  Location of Patient: Private Residence   Location of Provider: [place] and [place] participating in Telemedicine visit: Geryl Rankins FNP-BC [place]    History of Present Illness: Telemedicine visit for: HFU She has a past medical history of Hypertension and Renal disorder.  She was evaluated in the emergency room on July 25 with complaints of right lower quadrant abdominal pain radiating to the epigastric region.  She denied chest pain, constipation, cough, fever or shortness of breath at that time.  Due to concern for peritonitis a CT was ordered which did show multiple small mixed solid and groundglass nodular opacities with the largest in the right lower lobe measuring 1 cm.  CT of chest has been ordered at this time. Due to negative acute abdominal work-up she was treated for possible GERD symptoms with pantoprazole.     Today she states pain has significantly to manage and is only worsened when she does not take the pantoprazole.  I have instructed her that she should be taking this every day for now.  We will revisit whether to discontinue pantoprazole at her next office visit.   Lab Results  Component Value Date   LDLCALC 117 (H)  02/22/2021   .asc   Past Medical History:  Diagnosis Date   Hypertension    Renal disorder     History reviewed. No pertinent surgical history.  Family History  Problem Relation Age of Onset   Breast cancer Father    Hypertension Maternal Aunt     Social History   Socioeconomic History   Marital status: Widowed    Spouse name: Not on file   Number of children: Not on file   Years of education: Not on file   Highest education level: Not on file  Occupational History   Not on file  Tobacco Use   Smoking status: Never   Smokeless tobacco: Never  Vaping Use   Vaping Use: Never used  Substance and Sexual Activity   Alcohol use: No   Drug use: No   Sexual activity: Yes  Other Topics Concern   Not on file  Social History Narrative   Not on file   Social Determinants of Health   Financial Resource Strain: Not on file  Food Insecurity: Not on file  Transportation Needs: Not on file  Physical Activity: Not on file  Stress: Not on file  Social Connections: Not on file     Observations/Objective: Awake, alert and oriented x 3   ROS  Assessment and Plan: Diagnoses and all orders for this visit:  Hospital discharge follow-up  GERD without esophagitis -     pantoprazole (PROTONIX) 20 MG tablet; Take 1 tablet (20 mg total) by mouth daily. INSTRUCTIONS: Avoid GERD Triggers: acidic, spicy or fried  foods, caffeine, coffee, sodas,  alcohol and chocolate.    Essential hypertension -     amLODipine (NORVASC) 5 MG tablet; TAKE 1 TABLET (5 MG TOTAL) BY MOUTH DAILY. -     losartan (COZAAR) 100 MG tablet; TAKE 1 TABLET (100 MG TOTAL) BY MOUTH DAILY. Continue all antihypertensives as prescribed.  Remember to bring in your blood pressure log with you for your follow up appointment.  DASH/Mediterranean Diets are healthier choices for HTN.    Solitary pulmonary nodule -     CT Chest Wo Contrast; Future    Follow Up Instructions Return if symptoms worsen or fail to  improve.     I discussed the assessment and treatment plan with the patient. The patient was provided an opportunity to ask questions and all were answered. The patient agreed with the plan and demonstrated an understanding of the instructions.   The patient was advised to call back or seek an in-person evaluation if the symptoms worsen or if the condition fails to improve as anticipated.  I provided 15 minutes of non-face-to-face time during this encounter including median intraservice time, reviewing previous notes, labs, imaging, medications and explaining diagnosis and management.  Gildardo Pounds, FNP-BC

## 2021-06-16 ENCOUNTER — Other Ambulatory Visit: Payer: Self-pay

## 2021-06-17 ENCOUNTER — Ambulatory Visit (HOSPITAL_COMMUNITY)
Admission: RE | Admit: 2021-06-17 | Discharge: 2021-06-17 | Disposition: A | Discharge status: Home / Self Care | Payer: 59 | Source: Ambulatory Visit | Attending: Nurse Practitioner | Admitting: Nurse Practitioner

## 2021-06-17 ENCOUNTER — Other Ambulatory Visit: Payer: Self-pay

## 2021-06-17 DIAGNOSIS — R911 Solitary pulmonary nodule: Secondary | ICD-10-CM | POA: Insufficient documentation

## 2021-06-25 ENCOUNTER — Other Ambulatory Visit: Payer: Self-pay

## 2021-07-01 ENCOUNTER — Other Ambulatory Visit: Payer: Self-pay

## 2021-07-08 ENCOUNTER — Other Ambulatory Visit: Payer: Self-pay

## 2021-07-12 ENCOUNTER — Other Ambulatory Visit: Payer: Self-pay

## 2021-08-09 ENCOUNTER — Ambulatory Visit: Payer: 59 | Attending: Nurse Practitioner | Admitting: Nurse Practitioner

## 2021-08-09 ENCOUNTER — Other Ambulatory Visit: Payer: Self-pay | Admitting: Nurse Practitioner

## 2021-08-09 ENCOUNTER — Other Ambulatory Visit: Payer: Self-pay

## 2021-08-09 ENCOUNTER — Encounter: Payer: Self-pay | Admitting: Nurse Practitioner

## 2021-08-09 VITALS — BP 104/73 | HR 85 | Ht 66.0 in | Wt 160.2 lb

## 2021-08-09 DIAGNOSIS — N939 Abnormal uterine and vaginal bleeding, unspecified: Secondary | ICD-10-CM

## 2021-08-09 DIAGNOSIS — G5601 Carpal tunnel syndrome, right upper limb: Secondary | ICD-10-CM

## 2021-08-09 DIAGNOSIS — I10 Essential (primary) hypertension: Secondary | ICD-10-CM

## 2021-08-09 DIAGNOSIS — E875 Hyperkalemia: Secondary | ICD-10-CM

## 2021-08-09 DIAGNOSIS — E785 Hyperlipidemia, unspecified: Secondary | ICD-10-CM

## 2021-08-09 NOTE — Progress Notes (Signed)
Assessment & Plan:  Tobey was seen today for hypertension.  Diagnoses and all orders for this visit:  Essential hypertension Continue all antihypertensives as prescribed.  Remember to bring in your blood pressure log with you for your follow up appointment.  DASH/Mediterranean Diets are healthier choices for HTN.     Abnormal uterine bleeding -     US PELVIC COMPLETE WITH TRANSVAGINAL; Future  Carpal tunnel syndrome of right wrist -     Ambulatory referral to Hand Surgery  Dyslipidemia, goal LDL below 100 -     Lipid panel; Future INSTRUCTIONS: Work on a low fat, heart healthy diet and participate in regular aerobic exercise program by working out at least 150 minutes per week; 5 days a week-30 minutes per day. Avoid red meat/beef/steak,  fried foods. junk foods, sodas, sugary drinks, unhealthy snacking, alcohol and smoking.  Drink at least 80 oz of water per day and monitor your carbohydrate intake daily.    Hyperkalemia -     Potassium; Future   Patient has been counseled on age-appropriate routine health concerns for screening and prevention. These are reviewed and up-to-date. Referrals have been placed accordingly. Immunizations are up-to-date or declined.    Subjective:   Chief Complaint  Patient presents with   Hypertension   HPI Samantha Barr 40 y.o. female presents to office today follow up to HTN. She has a past medical history of Hypertension and Renal disorder.    AUB Notes having 2 menstrual cycles in August and 2 menstrual cycles in September. She is not sexually active.   Carpal Tunnel Symptoms Rotation of the right hand causes sharp pain. Associated symptoms: Numbness in right fingers (#1 and #2)   HTN Blood pressure is well controlled with amlodipine 5 mg daily and losartan 100 mg daily. Denies chest pain, shortness of breath, palpitations, lightheadedness, dizziness, headaches or BLE edema.   BP Readings from Last 3 Encounters:   08/09/21 104/73  05/24/21 122/77  05/07/21 117/81    BP Readings from Last 3 Encounters:  08/09/21 104/73  05/24/21 122/77  05/07/21 117/81       Review of Systems  Constitutional:  Negative for fever, malaise/fatigue and weight loss.  HENT: Negative.  Negative for nosebleeds.   Eyes: Negative.  Negative for blurred vision, double vision and photophobia.  Respiratory: Negative.  Negative for cough and shortness of breath.   Cardiovascular: Negative.  Negative for chest pain, palpitations and leg swelling.  Gastrointestinal: Negative.  Negative for heartburn, nausea and vomiting.  Genitourinary:        AUB  Musculoskeletal:  Positive for joint pain. Negative for myalgias.  Neurological: Negative.  Negative for dizziness, focal weakness, seizures and headaches.  Psychiatric/Behavioral: Negative.  Negative for suicidal ideas.    Past Medical History:  Diagnosis Date   Hypertension    Renal disorder     No past surgical history on file.  Family History  Problem Relation Age of Onset   Breast cancer Father    Hypertension Maternal Aunt     Social History Reviewed with no changes to be made today.   Outpatient Medications Prior to Visit  Medication Sig Dispense Refill   amLODipine (NORVASC) 5 MG tablet TAKE 1 TABLET (5 MG TOTAL) BY MOUTH DAILY. 90 tablet 3   ferrous gluconate (FERGON) 324 MG tablet TAKE 1 TABLET (324 MG TOTAL) BY MOUTH DAILY WITH BREAKFAST. 90 tablet 3   losartan (COZAAR) 100 MG tablet TAKE 1 TABLET (100 MG TOTAL)  BY MOUTH DAILY. 90 tablet 1   pantoprazole (PROTONIX) 20 MG tablet Take 1 tablet (20 mg total) by mouth daily. 90 tablet 0   rosuvastatin (CRESTOR) 20 MG tablet Take 1 tablet (20 mg total) by mouth daily. 90 tablet 1   Cholecalciferol (VITAMIN D3) 125 MCG (5000 UT) CAPS 1 (ONE) CAPSULE BY MOUTH DAILY (Patient not taking: No sig reported) 90 capsule 2   Vitamin D, Ergocalciferol, (DRISDOL) 1.25 MG (50000 UNIT) CAPS capsule TAKE 1 CAPSULE (50,000  UNITS TOTAL) BY MOUTH EVERY 7 (SEVEN) DAYS. (Patient not taking: Reported on 08/09/2021) 12 capsule 1   No facility-administered medications prior to visit.    Allergies  Allergen Reactions   Gabapentin     DIZZINESS       Objective:    BP 104/73   Pulse 85   Ht 5\' 6"  (1.676 m)   Wt 160 lb 4 oz (72.7 kg)   LMP 07/30/2021 (Exact Date)   SpO2 100%   BMI 25.87 kg/m  Wt Readings from Last 3 Encounters:  08/09/21 160 lb 4 oz (72.7 kg)  05/24/21 166 lb (75.3 kg)  05/07/21 157 lb 9.6 oz (71.5 kg)    Physical Exam Vitals and nursing note reviewed.  Constitutional:      Appearance: She is well-developed.  HENT:     Head: Normocephalic and atraumatic.  Cardiovascular:     Rate and Rhythm: Normal rate and regular rhythm.     Heart sounds: Normal heart sounds. No murmur heard.   No friction rub. No gallop.  Pulmonary:     Effort: Pulmonary effort is normal. No tachypnea or respiratory distress.     Breath sounds: Normal breath sounds. No decreased breath sounds, wheezing, rhonchi or rales.  Chest:     Chest wall: No tenderness.  Abdominal:     General: Bowel sounds are normal.     Palpations: Abdomen is soft.  Musculoskeletal:        General: Normal range of motion.     Cervical back: Normal range of motion.  Skin:    General: Skin is warm and dry.  Neurological:     Mental Status: She is alert and oriented to person, place, and time.     Coordination: Coordination normal.  Psychiatric:        Behavior: Behavior normal. Behavior is cooperative.        Thought Content: Thought content normal.        Judgment: Judgment normal.         Patient has been counseled extensively about nutrition and exercise as well as the importance of adherence with medications and regular follow-up. The patient was given clear instructions to go to ER or return to medical center if symptoms don't improve, worsen or new problems develop. The patient verbalized understanding.   Follow-up:  Return in about 3 months (around 11/09/2021).   Gildardo Pounds, FNP-BC [facility] and [facility], [place]   08/10/2021, 5:55 PM

## 2021-08-10 ENCOUNTER — Telehealth: Payer: Self-pay

## 2021-08-10 ENCOUNTER — Encounter: Payer: Self-pay | Admitting: Nurse Practitioner

## 2021-08-10 NOTE — Telephone Encounter (Signed)
MyChart message

## 2021-08-13 ENCOUNTER — Ambulatory Visit: Payer: 59 | Attending: Nurse Practitioner

## 2021-08-13 ENCOUNTER — Other Ambulatory Visit: Payer: Self-pay

## 2021-08-13 ENCOUNTER — Ambulatory Visit (HOSPITAL_COMMUNITY): Payer: 59

## 2021-08-13 DIAGNOSIS — E785 Hyperlipidemia, unspecified: Secondary | ICD-10-CM

## 2021-08-13 DIAGNOSIS — E875 Hyperkalemia: Secondary | ICD-10-CM

## 2021-08-14 LAB — LIPID PANEL
Chol/HDL Ratio: 4.4 ratio (ref 0.0–4.4)
Cholesterol, Total: 169 mg/dL (ref 100–199)
HDL: 38 mg/dL — ABNORMAL LOW (ref >39)
LDL Chol Calc (NIH): 98 mg/dL (ref 0–99)
Triglycerides: 190 mg/dL — ABNORMAL HIGH (ref 0–149)
VLDL Cholesterol Cal: 33 mg/dL (ref 5–40)

## 2021-08-14 LAB — POTASSIUM: Potassium: 4.8 mmol/L (ref 3.5–5.2)

## 2021-08-17 ENCOUNTER — Encounter: Payer: Self-pay | Admitting: Orthopaedic Surgery

## 2021-08-17 ENCOUNTER — Ambulatory Visit (INDEPENDENT_AMBULATORY_CARE_PROVIDER_SITE_OTHER): Payer: 59 | Admitting: Orthopaedic Surgery

## 2021-08-17 ENCOUNTER — Other Ambulatory Visit: Payer: Self-pay

## 2021-08-17 DIAGNOSIS — M778 Other enthesopathies, not elsewhere classified: Secondary | ICD-10-CM

## 2021-08-17 NOTE — Progress Notes (Signed)
   Office Visit Note   Patient: Samantha Barr           Date of Birth: 31-Mar-1981           MRN: [id number] Visit Date: 08/17/2021              Requested by: Gildardo Pounds, NP [place],  [place] [id number] PCP: Gildardo Pounds, NP   Assessment & Plan: Visit Diagnoses:  1. Extensor carpi ulnaris tendinitis     Plan: Impression is right wrist ECU tendinitis.  We have discussed repeat immobilization as well as topical NSAIDs as she is unable to tolerate oral NSAIDs.  I have also provided her with a work note for light duty and no repetitive motions right upper extremity for the next 4 weeks.  Should she continue to have pain at that point, she will follow-up with [place] for recheck.  Call with concerns or questions in the meantime.  Follow-Up Instructions: Return if symptoms worsen or fail to improve.   Orders:  No orders of the defined types were placed in this encounter.  No orders of the defined types were placed in this encounter.     Procedures: No procedures performed   Clinical Data: No additional findings.   Subjective: Chief Complaint  Patient presents with   Right Hand - Pain    HPI patient is a pleasant right-hand-dominant female who comes in today with right wrist and forearm pain for the past 2 to 3 years.  No known injury or change in activity, but she does note that she works in [employer].  The pain she has is worse with flexion of the wrist.  She has tried a wrist splint without relief of symptoms.  She has been taking Tylenol without any relief.  She is unable to take NSAIDs due to renal issues.  She denies any paresthesias to the right upper extremity.  Review of Systems as detailed in HPI.  All others reviewed and are negative.   Objective: Vital Signs: LMP 07/30/2021 (Exact Date)   Physical Exam well-developed well-nourished female no acute distress.  Alert and oriented x3.  Ortho Exam right wrist exam shows moderate  tenderness along the ECU tendons.  No tenderness to the distal radius or ulna.  No tenderness the first dorsal compartment.  Negative Finkelstein.  No pain or crepitus with grind test.  Pain with wrist flexion otherwise, painless range of motion of the wrist.  Negative Phalen and negative Tinel.  She is neurovascular tact distally.  Specialty Comments:  No specialty comments available.  Imaging: No new imaging   PMFS History: Patient Active Problem List   Diagnosis Date Noted   Essential hypertension 03/05/2018   Stage 3 chronic kidney disease ([place]) 03/05/2018   Past Medical History:  Diagnosis Date   Hypertension    Renal disorder     Family History  Problem Relation Age of Onset   Breast cancer Father    Hypertension Maternal Aunt     History reviewed. No pertinent surgical history. Social History   Occupational History   Not on file  Tobacco Use   Smoking status: Never   Smokeless tobacco: Never  Vaping Use   Vaping Use: Never used  Substance and Sexual Activity   Alcohol use: No   Drug use: No   Sexual activity: Yes

## 2021-08-20 ENCOUNTER — Ambulatory Visit (HOSPITAL_BASED_OUTPATIENT_CLINIC_OR_DEPARTMENT_OTHER)
Admission: RE | Admit: 2021-08-20 | Discharge: 2021-08-20 | Disposition: A | Discharge status: Home / Self Care | Payer: 59 | Source: Ambulatory Visit | Attending: Nurse Practitioner | Admitting: Nurse Practitioner

## 2021-08-20 ENCOUNTER — Other Ambulatory Visit: Payer: Self-pay

## 2021-08-20 ENCOUNTER — Telehealth: Payer: Self-pay | Admitting: Nurse Practitioner

## 2021-08-20 DIAGNOSIS — N939 Abnormal uterine and vaginal bleeding, unspecified: Secondary | ICD-10-CM | POA: Insufficient documentation

## 2021-08-20 NOTE — Telephone Encounter (Signed)
Cyndi Calling from [facility] calling for advice expected date states request appot 02/07/22 Is it ok to schedule now. Patient has 4:00p appt scheduled today is this ok CB- [phone]

## 2021-08-23 ENCOUNTER — Other Ambulatory Visit: Payer: Self-pay | Admitting: Nurse Practitioner

## 2021-08-23 DIAGNOSIS — N939 Abnormal uterine and vaginal bleeding, unspecified: Secondary | ICD-10-CM

## 2021-08-23 DIAGNOSIS — R9389 Abnormal findings on diagnostic imaging of other specified body structures: Secondary | ICD-10-CM

## 2021-08-25 NOTE — Telephone Encounter (Signed)
Pt had imaging done Friday November 21,2022.

## 2021-10-14 ENCOUNTER — Other Ambulatory Visit (HOSPITAL_COMMUNITY): Payer: Self-pay | Admitting: Nephrology

## 2021-10-14 ENCOUNTER — Other Ambulatory Visit (HOSPITAL_COMMUNITY): Payer: Self-pay | Admitting: Internal Medicine

## 2021-10-14 DIAGNOSIS — N183 Chronic kidney disease, stage 3 unspecified: Secondary | ICD-10-CM

## 2021-11-08 ENCOUNTER — Other Ambulatory Visit: Payer: Self-pay | Admitting: Radiology

## 2021-11-08 ENCOUNTER — Encounter: Payer: Self-pay | Admitting: Radiology

## 2021-11-09 ENCOUNTER — Ambulatory Visit (HOSPITAL_COMMUNITY)
Admission: RE | Admit: 2021-11-09 | Discharge: 2021-11-09 | Disposition: A | Discharge status: Home / Self Care | Payer: 59 | Source: Ambulatory Visit | Attending: Nephrology | Admitting: Nephrology

## 2021-11-09 ENCOUNTER — Other Ambulatory Visit: Payer: Self-pay

## 2021-11-09 DIAGNOSIS — I1310 Hypertensive heart and chronic kidney disease without heart failure, with stage 1 through stage 4 chronic kidney disease, or unspecified chronic kidney disease: Secondary | ICD-10-CM | POA: Insufficient documentation

## 2021-11-09 DIAGNOSIS — E559 Vitamin D deficiency, unspecified: Secondary | ICD-10-CM | POA: Diagnosis not present

## 2021-11-09 DIAGNOSIS — R809 Proteinuria, unspecified: Secondary | ICD-10-CM | POA: Insufficient documentation

## 2021-11-09 DIAGNOSIS — N183 Chronic kidney disease, stage 3 unspecified: Secondary | ICD-10-CM | POA: Insufficient documentation

## 2021-11-09 DIAGNOSIS — N028 Recurrent and persistent hematuria with other morphologic changes: Secondary | ICD-10-CM | POA: Diagnosis not present

## 2021-11-09 LAB — PREGNANCY, URINE: Preg Test, Ur: NEGATIVE

## 2021-11-09 LAB — CBC
HCT: 35.3 % — ABNORMAL LOW (ref 36.0–46.0)
Hemoglobin: 11.1 g/dL — ABNORMAL LOW (ref 12.0–15.0)
MCH: 26.6 pg (ref 26.0–34.0)
MCHC: 31.4 g/dL (ref 30.0–36.0)
MCV: 84.7 fL (ref 80.0–100.0)
Platelets: 209 10*3/uL (ref 150–400)
RBC: 4.17 MIL/uL (ref 3.87–5.11)
RDW: 14.6 % (ref 11.5–15.5)
WBC: 4.5 10*3/uL (ref 4.0–10.5)
nRBC: 0 % (ref 0.0–0.2)

## 2021-11-09 LAB — PROTIME-INR
INR: 1 (ref 0.8–1.2)
Prothrombin Time: 13.5 seconds (ref 11.4–15.2)

## 2021-11-09 MED ORDER — SODIUM CHLORIDE 0.9 % IV SOLN: INTRAVENOUS | Status: DC

## 2021-11-09 MED ORDER — MIDAZOLAM HCL 2 MG/2ML IJ SOLN
INTRAMUSCULAR | Status: AC | PRN
Start: 2021-11-09 — End: 2021-11-09
  Administered 2021-11-09: 1 mg via INTRAVENOUS

## 2021-11-09 MED ORDER — GELATIN ABSORBABLE 12-7 MM EX MISC
CUTANEOUS | Status: AC
  Filled 2021-11-09: qty 1

## 2021-11-09 MED ORDER — FENTANYL CITRATE (PF) 100 MCG/2ML IJ SOLN
INTRAMUSCULAR | Status: AC | PRN
Start: 2021-11-09 — End: 2021-11-09
  Administered 2021-11-09: 50 ug via INTRAVENOUS

## 2021-11-09 MED ORDER — MIDAZOLAM HCL 2 MG/2ML IJ SOLN
INTRAMUSCULAR | Status: AC
  Filled 2021-11-09: qty 2

## 2021-11-09 MED ORDER — LIDOCAINE HCL (PF) 1 % IJ SOLN
INTRAMUSCULAR | Status: AC
  Filled 2021-11-09: qty 30

## 2021-11-09 MED ORDER — FENTANYL CITRATE (PF) 100 MCG/2ML IJ SOLN
INTRAMUSCULAR | Status: AC
  Filled 2021-11-09: qty 2

## 2021-11-09 NOTE — H&P (Signed)
Chief Complaint: CKD, Proteinuria. 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 Request is for random renal biopsy  Referring Physician(s): Patel,Jay  Supervising Physician: Corrie Mckusick  Patient Status: [facility] - Out-pt  History of Present Illness: Samantha Barr is a 41 y.o. female  outpatient. History of  HTN, vitamin D deficiency, preeclampsia  and CKD. Found to have proteinuria. Team is requesting a random renal biopsy for further evaluation of possible hypertensive nephrosclerosis.  Currently without any significant complaints. Patient alert and laying in bed, calm and comfortable. Denies any fevers, headache, chest pain, SOB, cough, abdominal pain, nausea, vomiting or bleeding. Return precautions and treatment recommendations and follow-up discussed with the patient  who is agreeable with the plan.    Past Medical History:  Diagnosis Date   Hypertension    Renal disorder     No past surgical history on file.  Allergies: Gabapentin  Medications: Prior to Admission medications   Medication Sig Start Date End Date Taking? Authorizing Provider  amLODipine (NORVASC) 5 MG tablet TAKE 1 TABLET (5 MG TOTAL) BY MOUTH DAILY. 06/09/21 06/09/22 Yes Gildardo Pounds, NP  losartan (COZAAR) 100 MG tablet TAKE 1 TABLET (100 MG TOTAL) BY MOUTH DAILY. 06/09/21 06/09/22 Yes Gildardo Pounds, NP  rosuvastatin (CRESTOR) 20 MG tablet Take 20 mg by mouth daily.   Yes [provider]  Cholecalciferol (VITAMIN D3) 125 MCG (5000 UT) CAPS 1 (ONE) CAPSULE BY MOUTH DAILY Patient not taking: Reported on 05/07/2021 12/04/20 12/04/21  Elmarie Shiley, MD  ferrous gluconate (FERGON) 324 MG tablet TAKE 1 TABLET (324 MG TOTAL) BY MOUTH DAILY WITH BREAKFAST. Patient not taking: Reported on 11/01/2021 11/10/20 11/10/21  Gildardo Pounds, NP  Vitamin D, Ergocalciferol, (DRISDOL) 1.25 MG (50000 UNIT) CAPS capsule TAKE 1 CAPSULE (50,000 UNITS TOTAL) BY MOUTH EVERY 7 (SEVEN) DAYS. Patient not taking:  Reported on 08/09/2021 02/24/21 02/24/22  Gildardo Pounds, NP     Family History  Problem Relation Age of Onset   Breast cancer Father    Hypertension Maternal Aunt     Social History   Socioeconomic History   Marital status: Widowed    Spouse name: Not on file   Number of children: Not on file   Years of education: Not on file   Highest education level: Not on file  Occupational History   Not on file  Tobacco Use   Smoking status: Never   Smokeless tobacco: Never  Vaping Use   Vaping Use: Never used  Substance and Sexual Activity   Alcohol use: No   Drug use: No   Sexual activity: Yes  Other Topics Concern   Not on file  Social History Narrative   Not on file   Social Determinants of Health   Financial Resource Strain: Not on file  Food Insecurity: Not on file  Transportation Needs: Not on file  Physical Activity: Not on file  Stress: Not on file  Social Connections: Not on file   Review of Systems: A 12 point ROS discussed and pertinent positives are indicated in the HPI above.  All other systems are negative.  Review of Systems  Constitutional:  Negative for fatigue and fever.  HENT:  Negative for congestion.   Respiratory:  Negative for cough and shortness of breath.   Gastrointestinal:  Negative for abdominal pain, diarrhea, nausea and vomiting.   Vital Signs: LMP 11/05/2021 (Exact Date)   Physical Exam Vitals and nursing note reviewed.  Constitutional:      Appearance: She is  well-developed.  HENT:     Head: Normocephalic and atraumatic.  Eyes:     Conjunctiva/sclera: Conjunctivae normal.  Cardiovascular:     Rate and Rhythm: Normal rate and regular rhythm.     Heart sounds: Normal heart sounds.  Pulmonary:     Effort: Pulmonary effort is normal.     Breath sounds: Normal breath sounds.  Musculoskeletal:        General: Normal range of motion.     Cervical back: Normal range of motion.  Skin:    General: Skin is warm.  Neurological:      Mental Status: She is alert and oriented to person, place, and time.    Imaging: No results found.  Labs:  CBC: Recent Labs    02/22/21 1144 05/24/21 1342  WBC 5.6 10.3  HGB 11.8 13.4  HCT 37.1 40.9  PLT 199 217    COAGS: No results for input(s): INR, APTT in the last 8760 hours.  BMP: Recent Labs    02/22/21 1144 05/24/21 1342 08/13/21 1153  NA 141 139  --   K 4.6 5.2* 4.8  CL 108* 110  --   CO2 18* 23  --   GLUCOSE 87 96  --   BUN 28* 28*  --   CALCIUM 9.0 9.3  --   CREATININE 1.65* 1.81*  --   GFRNONAA  --  35*  --     LIVER FUNCTION TESTS: Recent Labs    02/22/21 1144 05/24/21 1342  BILITOT <0.2 0.6  AST 11 15  ALT 7 11  ALKPHOS 63 57  PROT 7.1 7.5  ALBUMIN 4.3 3.7      Assessment and Plan:  41 y.o. Female outpatient. History of  HTN, vitamin D deficiency, preeclampsia  and CKD. Found to have proteinuria. Team is requesting a random renal biopsy for further evaluation of possible hypertensive nephrosclerosis.   All labs and medications are within acceptable parameters. No pertinent allergies. Patient has been NPO since midnight.  Risks and benefits of random renal biopsy was discussed with the patient and/or patient's family including, but not limited to bleeding, infection, damage to adjacent structures or low yield requiring additional tests.  All of the questions were answered and there is agreement to proceed.  Consent signed and in chart.   Thank you for this interesting consult.  I greatly enjoyed meeting Shania Boshra Taifoi Wilms and look forward to participating in their care.  A copy of this report was sent to the requesting provider on this date.  Electronically Signed: Jacqualine Mau, NP 11/09/2021, 7:03 AM   I spent a total of  30 Minutes   in face to face in clinical consultation, greater than 50% of which was counseling/coordinating care for random renal biopsy

## 2021-11-09 NOTE — Procedures (Signed)
Interventional Radiology Procedure Note  Procedure: US guided biopsy of left kidney, medical renal Complications: None EBL: None Recommendations: - Bedrest 2 hours.   - Routine wound care - Follow up pathology - Advance diet   Signed,  Yennifer Segovia, DO   

## 2021-11-10 ENCOUNTER — Encounter: Payer: Self-pay | Admitting: Nurse Practitioner

## 2021-11-10 ENCOUNTER — Ambulatory Visit: Payer: 59 | Attending: Nurse Practitioner | Admitting: Nurse Practitioner

## 2021-11-10 DIAGNOSIS — N1832 Chronic kidney disease, stage 3b: Secondary | ICD-10-CM

## 2021-11-10 DIAGNOSIS — I10 Essential (primary) hypertension: Secondary | ICD-10-CM

## 2021-11-10 NOTE — Progress Notes (Signed)
Virtual Visit via Telephone Note Due to national recommendations of social distancing due to [place] 19, telehealth visit is felt to be most appropriate for this patient at this time.  I discussed the limitations, risks, security and privacy concerns of performing an evaluation and management service by telephone and the availability of in person appointments. I also discussed with the patient that there may be a patient responsible charge related to this service. The patient expressed understanding and agreed to proceed.    I connected with Samantha Barr on 11/10/21  at   4:10 PM EST  EDT by telephone and verified that I am speaking with the correct person using two identifiers.  Location of Patient: Private Residence   Location of Provider: [place] participating in Telemedicine visit: Geryl Rankins FNP-BC [place]    History of Present Illness: Telemedicine visit for: HTN She has a history of CKD stage 3. Being followed by nephrology with biopsy planned.   HTN Blood pressure is well controlled. She is taking amlodipine 5 mg daily and losartan 100 mg daily as prescribed.  BP Readings from Last 3 Encounters:  11/09/21 114/85  08/09/21 104/73  05/24/21 122/77       Past Medical History:  Diagnosis Date   Hypertension    Renal disorder     History reviewed. No pertinent surgical history.  Family History  Problem Relation Age of Onset   Breast cancer Father    Hypertension Maternal Aunt     Social History   Socioeconomic History   Marital status: Widowed    Spouse name: Not on file   Number of children: Not on file   Years of education: Not on file   Highest education level: Not on file  Occupational History   Not on file  Tobacco Use   Smoking status: Never   Smokeless tobacco: Never  Vaping Use   Vaping Use: Never used  Substance and Sexual Activity   Alcohol use: No   Drug  use: No   Sexual activity: Yes  Other Topics Concern   Not on file  Social History Narrative   Not on file   Social Determinants of Health   Financial Resource Strain: Not on file  Food Insecurity: Not on file  Transportation Needs: Not on file  Physical Activity: Not on file  Stress: Not on file  Social Connections: Not on file     Observations/Objective: Awake, alert and oriented x 3   Review of Systems  Constitutional:  Negative for fever, malaise/fatigue and weight loss.  HENT: Negative.  Negative for nosebleeds.   Eyes: Negative.  Negative for blurred vision, double vision and photophobia.  Respiratory: Negative.  Negative for cough and shortness of breath.   Cardiovascular: Negative.  Negative for chest pain, palpitations and leg swelling.  Gastrointestinal: Negative.  Negative for heartburn, nausea and vomiting.  Musculoskeletal: Negative.  Negative for myalgias.  Neurological: Negative.  Negative for dizziness, focal weakness, seizures and headaches.  Psychiatric/Behavioral: Negative.  Negative for suicidal ideas.    Assessment and Plan: Diagnoses and all orders for this visit:  Essential hypertension Continue all antihypertensives as prescribed.  Remember to bring in your blood pressure log with you for your follow up appointment.  DASH/Mediterranean Diets are healthier choices for HTN.    Stage 3b chronic kidney disease ([place]) Continue follow up with nephrology as scheduled     Follow Up Instructions Return for Physical  ONLY no labs.     I discussed the assessment and treatment plan with the patient. The patient was provided an opportunity to ask questions and all were answered. The patient agreed with the plan and demonstrated an understanding of the instructions.   The patient was advised to call back or seek an in-person evaluation if the symptoms worsen or if the condition fails to improve as anticipated.  I provided 10 minutes of non-face-to-face time  during this encounter including median intraservice time, reviewing previous notes, labs, imaging, medications and explaining diagnosis and management.  Gildardo Pounds, FNP-BC

## 2021-11-11 ENCOUNTER — Encounter (HOSPITAL_COMMUNITY): Payer: Self-pay

## 2021-11-11 LAB — SURGICAL PATHOLOGY

## 2022-01-06 ENCOUNTER — Other Ambulatory Visit: Payer: Self-pay

## 2022-01-07 ENCOUNTER — Other Ambulatory Visit: Payer: Self-pay

## 2022-01-10 ENCOUNTER — Other Ambulatory Visit: Payer: Self-pay

## 2022-01-24 ENCOUNTER — Other Ambulatory Visit: Payer: Self-pay

## 2022-01-24 ENCOUNTER — Encounter: Payer: Self-pay | Admitting: Nurse Practitioner

## 2022-01-24 ENCOUNTER — Ambulatory Visit: Payer: 59 | Attending: Nurse Practitioner | Admitting: Nurse Practitioner

## 2022-01-24 DIAGNOSIS — H43391 Other vitreous opacities, right eye: Secondary | ICD-10-CM

## 2022-01-24 DIAGNOSIS — I150 Renovascular hypertension: Secondary | ICD-10-CM | POA: Diagnosis not present

## 2022-01-24 DIAGNOSIS — E785 Hyperlipidemia, unspecified: Secondary | ICD-10-CM

## 2022-01-24 MED ORDER — ROSUVASTATIN CALCIUM 20 MG PO TABS
20.0000 mg | ORAL_TABLET | Freq: Every day | ORAL | 3 refills | Status: DC
  Filled 2022-01-24: qty 90, 90d supply, fill #0
  Filled 2022-08-04: qty 30, 30d supply, fill #1

## 2022-01-24 NOTE — Progress Notes (Signed)
Virtual Visit via Telephone Note ?Due to national recommendations of social distancing due to [place] 19, telehealth visit is felt to be most appropriate for this patient at this time.  I discussed the limitations, risks, security and privacy concerns of performing an evaluation and management service by telephone and the availability of in person appointments. I also discussed with the patient that there may be a patient responsible charge related to this service. The patient expressed understanding and agreed to proceed.  ? ? ?I connected with Samantha Barr on 01/24/22  at   3:30 PM EDT  EDT by telephone and verified that I am speaking with the correct person using two identifiers. ? ?Location of Patient: ?Private Residence ?  ?Location of Provider: ?[facility], research (physical sciences) and [employer] Office  ?  ?Persons participating in Telemedicine visit: ?Samantha Rankins FNP-BC ?Samantha Barr  ?  ?History of Present Illness: ?Telemedicine visit for: Eye problem ?She has a past medical history of Hypertension and Renal disorder.  ? ?Started seeing floaters in the right eye several months ago. Needs ophthalmology referral.  ? ?CKD Stage 3 ?Currently being followed by Nephrology. 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 States she will be started on a new medication by nephrology however she can no recall the name of it at this time. She continues on losartan 100 mg daily and amlodipine 5 mg daily.  ?BP Readings from Last 3 Encounters:  ?11/09/21 114/85  ?08/09/21 104/73  ?05/24/21 122/77  ? ?GYN ?She was referred to Gynecology for abnormal uterine bleeding (menorrhagia) and abnormal uterine thickness on imaging however she declined the appointment stating she needed a sooner appointment than when they were able to schedule. Will place new referral at this time.  ? ?Past Medical History:  ?Diagnosis Date  ? Hypertension   ? Renal disorder   ?  ?History reviewed. No pertinent surgical  history.  ?Family History  ?Problem Relation Age of Onset  ? Breast cancer Father   ? Hypertension Maternal Aunt   ?  ?Social History  ? ?Socioeconomic History  ? Marital status: Widowed  ?  Spouse name: Not on file  ? Number of children: Not on file  ? Years of education: Not on file  ? Highest education level: Not on file  ?Occupational History  ? Not on file  ?Tobacco Use  ? Smoking status: Never  ? Smokeless tobacco: Never  ?Vaping Use  ? Vaping Use: Never used  ?Substance and Sexual Activity  ? Alcohol use: No  ? Drug use: No  ? Sexual activity: Yes  ?Other Topics Concern  ? Not on file  ?Social History Narrative  ? Not on file  ? ?Social Determinants of Health  ? ?Financial Resource Strain: Not on file  ?Food Insecurity: Not on file  ?Transportation Needs: Not on file  ?Physical Activity: Not on file  ?Stress: Not on file  ?Social Connections: Not on file  ?  ? ?Observations/Objective: ?Awake, alert and oriented x 3 ? ? ?Review of Systems  ?Constitutional:  Negative for fever, malaise/fatigue and weight loss.  ?HENT: Negative.  Negative for nosebleeds.   ?Eyes:  Positive for blurred vision. Negative for double vision and photophobia.  ?     SEE HPI  ?Respiratory: Negative.  Negative for cough and shortness of breath.   ?Cardiovascular: Negative.  Negative for chest pain, palpitations and leg swelling.  ?Gastrointestinal: Negative.  Negative for heartburn, nausea and vomiting.  ?Musculoskeletal: Negative.  Negative for myalgias.  ?  Neurological: Negative.  Negative for dizziness, focal weakness, seizures and headaches.  ?Psychiatric/Behavioral: Negative.  Negative for suicidal ideas.    ?Assessment and Plan: ?Diagnoses and all orders for this visit: ? ?Vitreous floaters of right eye ?-     Ambulatory referral to Ophthalmology ? ?Renovascular hypertension ?Continue all medications as prescribed ? ?Dyslipidemia, goal LDL below 100 ?-     rosuvastatin (CRESTOR) 20 MG tablet; Take 1 tablet (20 mg total) by mouth  daily. ? ?  ? ?Follow Up Instructions ?No follow-ups on file.  ? ?  ?I discussed the assessment and treatment plan with the patient. The patient was provided an opportunity to ask questions and all were answered. The patient agreed with the plan and demonstrated an understanding of the instructions. ?  ?The patient was advised to call back or seek an in-person evaluation if the symptoms worsen or if the condition fails to improve as anticipated. ? ?I provided 12 minutes of non-face-to-face time during this encounter including median intraservice time, reviewing previous notes, labs, imaging, medications and explaining diagnosis and management. ? ?Gildardo Pounds, FNP-BC  ?

## 2022-04-08 ENCOUNTER — Other Ambulatory Visit: Payer: Self-pay

## 2022-04-08 ENCOUNTER — Ambulatory Visit: Payer: 59 | Attending: Nurse Practitioner | Admitting: Nurse Practitioner

## 2022-04-08 ENCOUNTER — Encounter: Payer: Self-pay | Admitting: Nurse Practitioner

## 2022-04-08 VITALS — BP 118/81 | HR 93 | Ht 66.0 in | Wt 162.0 lb

## 2022-04-08 DIAGNOSIS — R9389 Abnormal findings on diagnostic imaging of other specified body structures: Secondary | ICD-10-CM

## 2022-04-08 DIAGNOSIS — J0110 Acute frontal sinusitis, unspecified: Secondary | ICD-10-CM

## 2022-04-08 DIAGNOSIS — N939 Abnormal uterine and vaginal bleeding, unspecified: Secondary | ICD-10-CM | POA: Diagnosis not present

## 2022-04-08 DIAGNOSIS — Z1231 Encounter for screening mammogram for malignant neoplasm of breast: Secondary | ICD-10-CM | POA: Diagnosis not present

## 2022-04-08 DIAGNOSIS — Z0000 Encounter for general adult medical examination without abnormal findings: Secondary | ICD-10-CM | POA: Diagnosis not present

## 2022-04-08 DIAGNOSIS — M269 Dentofacial anomaly, unspecified: Secondary | ICD-10-CM

## 2022-04-08 MED ORDER — AMOXICILLIN-POT CLAVULANATE 875-125 MG PO TABS
1.0000 | ORAL_TABLET | Freq: Two times a day (BID) | ORAL | 0 refills | Status: AC
  Filled 2022-04-08: qty 14, 7d supply, fill #0

## 2022-04-08 NOTE — Progress Notes (Signed)
Assessment & Plan:  Samantha Barr was seen today for annual exam.  Diagnoses and all orders for this visit:  Encounter for annual physical exam -     CBC with Differential -     CMP14+EGFR  Abnormal uterine bleeding (AUB) -     Ambulatory referral to Gynecology  Abnormal pelvic ultrasound -     Ambulatory referral to Gynecology  Breast cancer screening by mammogram -     Cancel: MM 3D SCREEN BREAST BILATERAL; Future -     MM 3D SCREEN BREAST BILATERAL; Future  Jaw anomaly -     DG Facial Bones Complete; Future  Acute non-recurrent frontal sinusitis -     amoxicillin-clavulanate (AUGMENTIN) 875-125 MG tablet; Take 1 tablet by mouth 2 (two) times daily for 7 days.    Patient has been counseled on age-appropriate routine health concerns for screening and prevention. These are reviewed and up-to-date. Referrals have been placed accordingly. Immunizations are up-to-date or declined.    Subjective:   Chief Complaint  Patient presents with   Annual Exam   HPI Samantha Barr 41 y.o. female presents to office today for annual exam  She was started on [place] by her nephrologist to help slow the decline of her kidneys.   She states she has not been feeling like herself over the past few weeks and endorses episodes of lightheadedness lasting seconds to hours. Also notes feeling of fullness or pressure in her face, ears, sinuses. Notes burning or heat when she takes in a deep breath through her nose.   AUB She has a history of AUB with excessive bleeding 2 months in a row. States irregular cycles have improved in regularity however she did have a pelvic US performed which showed increased endometrial thickening. She was referred to gynecology in November however they did not have any openings until 2023 so she declined scheduling at that time. She has been lost to follow up and I will place another referral to GYN at this time.   Upper respiratory symptoms She complains  of bilateral ear pressure/pain, congestion, facial pain, hand/face or pedal edema, lightheadedness, nasal congestion, and sinus pressure.with no fever, chills, night sweats or weight loss. Onset of symptoms was a few weeks ago with  waxing and waning .She is drinking plenty of fluids.  Past history is significant for no history of pneumonia or bronchitis. Patient is non-smoker   She has a small area that protrudes from the lower right posterior jaw. There is no tenderness with palpation and lymph nodes are non palpable in this area. Unclear if this is bone or cyst in the area but will order xray at this time.    Review of Systems  Constitutional:  Negative for fever, malaise/fatigue and weight loss.  HENT:  Positive for congestion and sinus pain. Negative for nosebleeds.        SEE HPI  Eyes: Negative.  Negative for blurred vision, double vision and photophobia.  Respiratory: Negative.  Negative for cough and shortness of breath.   Cardiovascular: Negative.  Negative for chest pain, palpitations and leg swelling.  Gastrointestinal: Negative.  Negative for heartburn, nausea and vomiting.  Musculoskeletal: Negative.  Negative for myalgias.  Neurological: Negative.  Negative for dizziness, focal weakness, seizures and headaches.  Psychiatric/Behavioral: Negative.  Negative for suicidal ideas.     Past Medical History:  Diagnosis Date   Hypertension    Renal disorder     No past surgical history on file.  Family  History  Problem Relation Age of Onset   Breast cancer Father    Hypertension Maternal Aunt     Social History Reviewed with no changes to be made today.   Outpatient Medications Prior to Visit  Medication Sig Dispense Refill   amLODipine (NORVASC) 5 MG tablet TAKE 1 TABLET (5 MG TOTAL) BY MOUTH DAILY. 90 tablet 3   losartan (COZAAR) 100 MG tablet TAKE 1 TABLET (100 MG TOTAL) BY MOUTH DAILY. 90 tablet 1   rosuvastatin (CRESTOR) 20 MG tablet Take 1 tablet (20 mg total) by  mouth daily. 90 tablet 3   ferrous gluconate (FERGON) 324 MG tablet TAKE 1 TABLET (324 MG TOTAL) BY MOUTH DAILY WITH BREAKFAST. (Patient not taking: Reported on 11/01/2021) 90 tablet 3   No facility-administered medications prior to visit.    Allergies  Allergen Reactions   Gabapentin     DIZZINESS       Objective:    BP 118/81   Pulse 93   Ht 5' 6"  (1.676 m)   Wt 162 lb (73.5 kg)   SpO2 100%   BMI 26.15 kg/m  Wt Readings from Last 3 Encounters:  04/08/22 162 lb (73.5 kg)  11/09/21 170 lb (77.1 kg)  08/09/21 160 lb 4 oz (72.7 kg)    Physical Exam Vitals and nursing note reviewed.  Constitutional:      Appearance: She is well-developed.  HENT:     Head: Normocephalic and atraumatic.     Jaw: No trismus, tenderness, swelling, pain on movement or malocclusion.      Nose:     Right Turbinates: Enlarged and swollen.     Left Turbinates: Enlarged and swollen.     Right Sinus: Maxillary sinus tenderness and frontal sinus tenderness present.     Left Sinus: Maxillary sinus tenderness and frontal sinus tenderness present.     Comments: Erythematous nasal mucosa bilaterally  Cardiovascular:     Rate and Rhythm: Normal rate and regular rhythm.     Heart sounds: Normal heart sounds. No murmur heard.    No friction rub. No gallop.  Pulmonary:     Effort: Pulmonary effort is normal. No tachypnea or respiratory distress.     Breath sounds: Normal breath sounds. No decreased breath sounds, wheezing, rhonchi or rales.  Chest:     Chest wall: No tenderness.  Abdominal:     General: Bowel sounds are normal.     Palpations: Abdomen is soft.  Musculoskeletal:        General: Normal range of motion.     Cervical back: Normal range of motion.  Skin:    General: Skin is warm and dry.  Neurological:     Mental Status: She is alert and oriented to person, place, and time.     Coordination: Coordination normal.  Psychiatric:        Behavior: Behavior normal. Behavior is cooperative.         Thought Content: Thought content normal.        Judgment: Judgment normal.          Patient has been counseled extensively about nutrition and exercise as well as the importance of adherence with medications and regular follow-up. The patient was given clear instructions to go to ER or return to medical center if symptoms don't improve, worsen or new problems develop. The patient verbalized understanding.   Follow-up: Return in about 3 months (around 07/09/2022).   Gildardo Pounds, FNP-BC [place]  [place], [place]   04/08/2022, 5:03 PM

## 2022-04-09 LAB — CMP14+EGFR
ALT: 12 IU/L (ref 0–32)
AST: 19 IU/L (ref 0–40)
Albumin/Globulin Ratio: 1.3 (ref 1.2–2.2)
Albumin: 4.1 g/dL (ref 3.8–4.8)
Alkaline Phosphatase: 61 IU/L (ref 44–121)
BUN/Creatinine Ratio: 19 (ref 9–23)
BUN: 41 mg/dL — ABNORMAL HIGH (ref 6–24)
Bilirubin Total: 0.3 mg/dL (ref 0.0–1.2)
CO2: 17 mmol/L — ABNORMAL LOW (ref 20–29)
Calcium: 9.4 mg/dL (ref 8.7–10.2)
Chloride: 109 mmol/L — ABNORMAL HIGH (ref 96–106)
Creatinine, Ser: 2.21 mg/dL — ABNORMAL HIGH (ref 0.57–1.00)
Globulin, Total: 3.1 g/dL (ref 1.5–4.5)
Glucose: 112 mg/dL — ABNORMAL HIGH (ref 70–99)
Potassium: 4.9 mmol/L (ref 3.5–5.2)
Sodium: 143 mmol/L (ref 134–144)
Total Protein: 7.2 g/dL (ref 6.0–8.5)
eGFR: 28 mL/min/{1.73_m2} — ABNORMAL LOW (ref >59)

## 2022-04-09 LAB — CBC WITH DIFFERENTIAL/PLATELET

## 2022-05-04 ENCOUNTER — Other Ambulatory Visit: Payer: Self-pay | Admitting: Nurse Practitioner

## 2022-05-04 DIAGNOSIS — M269 Dentofacial anomaly, unspecified: Secondary | ICD-10-CM

## 2022-05-04 NOTE — Progress Notes (Signed)
Faxed order to [facility] and received confirmation.

## 2022-05-12 ENCOUNTER — Ambulatory Visit: Payer: 59 | Attending: Nurse Practitioner

## 2022-05-12 ENCOUNTER — Encounter: Payer: Self-pay | Admitting: Nurse Practitioner

## 2022-05-12 DIAGNOSIS — I10 Essential (primary) hypertension: Secondary | ICD-10-CM

## 2022-05-13 LAB — CMP14+EGFR
ALT: 9 IU/L (ref 0–32)
AST: 13 IU/L (ref 0–40)
Albumin/Globulin Ratio: 1.4 (ref 1.2–2.2)
Albumin: 3.9 g/dL (ref 3.9–4.9)
Alkaline Phosphatase: 62 IU/L (ref 44–121)
BUN/Creatinine Ratio: 18 (ref 9–23)
BUN: 32 mg/dL — ABNORMAL HIGH (ref 6–24)
Bilirubin Total: 0.2 mg/dL (ref 0.0–1.2)
CO2: 20 mmol/L (ref 20–29)
Calcium: 8.8 mg/dL (ref 8.7–10.2)
Chloride: 104 mmol/L (ref 96–106)
Creatinine, Ser: 1.79 mg/dL — ABNORMAL HIGH (ref 0.57–1.00)
Globulin, Total: 2.8 g/dL (ref 1.5–4.5)
Glucose: 102 mg/dL — ABNORMAL HIGH (ref 70–99)
Potassium: 5 mmol/L (ref 3.5–5.2)
Sodium: 139 mmol/L (ref 134–144)
Total Protein: 6.7 g/dL (ref 6.0–8.5)
eGFR: 36 mL/min/{1.73_m2} — ABNORMAL LOW (ref >59)

## 2022-05-13 LAB — CBC WITH DIFFERENTIAL/PLATELET
Basophils Absolute: 0.1 10*3/uL (ref 0.0–0.2)
Basos: 1 %
EOS (ABSOLUTE): 0.1 10*3/uL (ref 0.0–0.4)
Eos: 1 %
Hematocrit: 35.2 % (ref 34.0–46.6)
Hemoglobin: 11.1 g/dL (ref 11.1–15.9)
Immature Grans (Abs): 0 10*3/uL (ref 0.0–0.1)
Immature Granulocytes: 0 %
Lymphocytes Absolute: 1 10*3/uL (ref 0.7–3.1)
Lymphs: 13 %
MCH: 25.4 pg — ABNORMAL LOW (ref 26.6–33.0)
MCHC: 31.5 g/dL (ref 31.5–35.7)
MCV: 81 fL (ref 79–97)
Monocytes Absolute: 0.3 10*3/uL (ref 0.1–0.9)
Monocytes: 4 %
Neutrophils Absolute: 6.2 10*3/uL (ref 1.4–7.0)
Neutrophils: 81 %
Platelets: 237 10*3/uL (ref 150–450)
RBC: 4.37 x10E6/uL (ref 3.77–5.28)
RDW: 16 % — ABNORMAL HIGH (ref 11.7–15.4)
WBC: 7.6 10*3/uL (ref 3.4–10.8)

## 2022-06-09 ENCOUNTER — Ambulatory Visit (INDEPENDENT_AMBULATORY_CARE_PROVIDER_SITE_OTHER): Payer: 59 | Admitting: Family Medicine

## 2022-06-09 ENCOUNTER — Encounter: Payer: Self-pay | Admitting: Family Medicine

## 2022-06-09 ENCOUNTER — Other Ambulatory Visit: Payer: Self-pay

## 2022-06-09 ENCOUNTER — Encounter: Payer: 59 | Admitting: Student

## 2022-06-09 VITALS — BP 131/86 | HR 93 | Ht 61.5 in | Wt 164.9 lb

## 2022-06-09 DIAGNOSIS — N939 Abnormal uterine and vaginal bleeding, unspecified: Secondary | ICD-10-CM

## 2022-06-09 MED ORDER — MEGESTROL ACETATE 40 MG PO TABS
40.0000 mg | ORAL_TABLET | Freq: Every day | ORAL | 1 refills | Status: AC
  Filled 2022-06-09: qty 30, 30d supply, fill #0

## 2022-06-09 NOTE — Progress Notes (Signed)
GYNECOLOGY OFFICE VISIT NOTE  History:   Samantha Barr is a 41 y.o. No obstetric history on file. here today for AUB.  Patient notes that she has been bleeding intermittently prolonged sense last fall.  She will bleed normally typically from 7 to 12 days.  Last year, she had 2 months where she bled lightly for closer to 15 days.  Last month, the same thing happened where patient started bleeding lightly for 15 days.  She has associated dizziness upon ambulation when she is having these periods.  She is currently menstruating.  She had an ultrasound in October 2022 showing thickened endometrial stripe.  She has a BMI of 30.65 and she has CKD stage III.  Patient declines abdominal pain that is worsened throughout these prolonged period episodes.  She denies any abnormal vaginal discharge, pelvic pain or other concerns.    Past Medical History:  Diagnosis Date   Hypertension    Renal disorder     History reviewed. No pertinent surgical history.  The following portions of the patient's history were reviewed and updated as appropriate: allergies, current medications, past family history, past medical history, past social history, past surgical history and problem list.   Health Maintenance:  Normal pap and negative HRHPV on 2022.  Normal mammogram on 03/2022.   Review of Systems:  Pertinent items noted in HPI and remainder of comprehensive ROS otherwise negative.  Physical Exam:  BP 131/86   Pulse 93   Ht 5' 1.5" (1.562 m)   Wt 164 lb 14.4 oz (74.8 kg)   LMP 06/08/2022   BMI 30.65 kg/m  CONSTITUTIONAL: Well-developed, well-nourished female in no acute distress.  HEENT:  Normocephalic, atraumatic. External right and left ear normal. No scleral icterus.  NECK: Normal range of motion, supple, no masses noted on observation SKIN: No rash noted. Not diaphoretic. No erythema. No pallor. MUSCULOSKELETAL: Normal range of motion. No edema noted. NEUROLOGIC: Alert and oriented to  person, place, and time. Normal muscle tone coordination.  PSYCHIATRIC: Normal mood and affect. Normal behavior. Normal judgment and thought content. CARDIOVASCULAR: Normal heart rate noted RESPIRATORY: Effort and breath sounds normal, no problems with respiration noted ABDOMEN: No masses noted. No other overt distention noted.   PELVIC: Deferred  Labs and Imaging [place] from 08/20/21 CLINICAL DATA:  Abnormal uterine bleeding   EXAM: TRANSABDOMINAL AND TRANSVAGINAL ULTRASOUND OF PELVIS   TECHNIQUE: Both transabdominal and transvaginal ultrasound examinations of the pelvis were performed. Transabdominal technique was performed for global imaging of the pelvis including uterus, ovaries, adnexal regions, and pelvic cul-de-sac. It was necessary to proceed with endovaginal exam following the transabdominal exam to visualize the uterus endometrium ovaries.   COMPARISON:  CT 05/24/2021   FINDINGS: Uterus   Measurements: 8.9 x 5.1 x 6.3 cm = volume: 149.2 mL. No fibroids or other mass visualized.   Endometrium   Thickness: 16.7 mm.  No focal abnormality visualized.   Right ovary   Measurements: 4 x 2.6 x 3.3 cm = volume: 18.1 mL. Probable corpus luteum in the right ovary measuring 2 cm, no further follow-up recommended.   Left ovary   Measurements: 3.1 x 1.7 x 2.5 cm = volume: 6.9 mL. Normal appearance/no adnexal mass.   Other findings   Trabeculated appearing bladder wall.   IMPRESSION: 1. Endometrial thickness of 16.7 mm. If bleeding remains unresponsive to hormonal or medical therapy, focal lesion work-up with sonohysterogram should be considered. Endometrial biopsy should also be considered in pre-menopausal patients at high  risk for endometrial carcinoma. (Ref: Radiological Reasoning: Algorithmic Workup of Abnormal Vaginal Bleeding with Endovaginal Sonography and Sonohysterography. AJR 2008; [id number]) 2. Incidental note made of trabeculated appearing urinary  bladder, correlate for any urinary tract symptoms.   Assessment and Plan:  1. Abnormal uterine bleeding (AUB) - CBC w/Diff - TSH - megestrol (MEGACE) 40 MG tablet; Take 1 tablet (40 mg total) by mouth daily.  Dispense: 30 tablet; Refill: 1 - Prolactin - Beta hCG quant (ref lab)    Patient presents with abnormal uterine bleeding on and off for the past year.  She underwent ultrasound in October 2022 which showed thickened endometrial stripe.  The periods are characterized as irregular in nature but prolonged and light.  Obtain labs as above, started Megace.  Given her risk factors including BMI and CKD, I think is appropriate to move forward with endometrial biopsy.  Patient will schedule this for her next visit.  Routine preventative health maintenance measures emphasized. Please refer to After Visit Summary for other counseling recommendations.   Return in about 1 week (around 06/16/2022) for Endometrial biopsy.     Shelda Pal, DO OB Fellow, [place] [facility] [place] 06/09/2022 4:50 PM

## 2022-06-09 NOTE — Progress Notes (Signed)
Pt presents to discuss irregular periods.  Reports that last year she noticed a change in frequency with her periods. Noticed that she would have 2 cycles per month. This year she notices her periods are much lighter and the cycles are shorter some months, such as a 22 day cycle.   Not using birth control.

## 2022-06-10 ENCOUNTER — Other Ambulatory Visit: Payer: Self-pay

## 2022-06-11 LAB — SPECIMEN STATUS REPORT

## 2022-06-11 LAB — CBC WITH DIFFERENTIAL/PLATELET
Basophils Absolute: 0.1 10*3/uL (ref 0.0–0.2)
Basos: 1 %
EOS (ABSOLUTE): 0.1 10*3/uL (ref 0.0–0.4)
Eos: 1 %
Hematocrit: 37.2 % (ref 34.0–46.6)
Hemoglobin: 11.7 g/dL (ref 11.1–15.9)
Immature Grans (Abs): 0 10*3/uL (ref 0.0–0.1)
Immature Granulocytes: 0 %
Lymphocytes Absolute: 1.7 10*3/uL (ref 0.7–3.1)
Lymphs: 16 %
MCH: 25.7 pg — ABNORMAL LOW (ref 26.6–33.0)
MCHC: 31.5 g/dL (ref 31.5–35.7)
MCV: 82 fL (ref 79–97)
Monocytes Absolute: 0.5 10*3/uL (ref 0.1–0.9)
Monocytes: 5 %
Neutrophils Absolute: 8 10*3/uL — ABNORMAL HIGH (ref 1.4–7.0)
Neutrophils: 77 %
Platelets: 271 10*3/uL (ref 150–450)
RBC: 4.56 x10E6/uL (ref 3.77–5.28)
RDW: 16.9 % — ABNORMAL HIGH (ref 11.7–15.4)
WBC: 10.3 10*3/uL (ref 3.4–10.8)

## 2022-06-11 LAB — TSH: TSH: 0.368 u[IU]/mL — ABNORMAL LOW (ref 0.450–4.500)

## 2022-06-11 LAB — BETA HCG QUANT (REF LAB): hCG Quant: <1 m[IU]/mL

## 2022-06-11 LAB — PROLACTIN: Prolactin: 23.6 ng/mL — ABNORMAL HIGH (ref 4.8–23.3)

## 2022-06-16 ENCOUNTER — Other Ambulatory Visit: Payer: Self-pay

## 2022-06-23 NOTE — Progress Notes (Signed)
TC to notify pt of abnormal labs and need for additional labs. No answer. HIPPA compliant VM left. MyChart message sent. Please call pt to schedule lab only visit in the next 2 weeks.

## 2022-07-01 ENCOUNTER — Telehealth: Payer: Self-pay

## 2022-07-01 NOTE — Telephone Encounter (Signed)
Attempted to contact about results and need for lab appt, no answer, left vm

## 2022-07-05 NOTE — Progress Notes (Signed)
3rd TC to pt to notify of abnormal labs and need for further labs. No answer. Left HIPPA compliant VM. Message sent via MyChart as well.

## 2022-07-06 ENCOUNTER — Other Ambulatory Visit: Payer: Self-pay | Admitting: *Deleted

## 2022-07-06 DIAGNOSIS — N939 Abnormal uterine and vaginal bleeding, unspecified: Secondary | ICD-10-CM

## 2022-07-06 NOTE — Progress Notes (Signed)
Pt returned TC regarding abnormal labs and need for additional labs. All questions answered. Pt verbalized understanding. Call transferred to schedulers.

## 2022-07-06 NOTE — Progress Notes (Signed)
Pt returned call and labs were scheduled.

## 2022-07-07 ENCOUNTER — Other Ambulatory Visit: Payer: Self-pay

## 2022-07-07 DIAGNOSIS — N939 Abnormal uterine and vaginal bleeding, unspecified: Secondary | ICD-10-CM

## 2022-07-08 LAB — PROLACTIN: Prolactin: 23.9 ng/mL — ABNORMAL HIGH (ref 4.8–23.3)

## 2022-07-08 LAB — T4, FREE: Free T4: 1.06 ng/dL (ref 0.82–1.77)

## 2022-07-18 ENCOUNTER — Telehealth: Payer: Self-pay

## 2022-07-18 NOTE — Telephone Encounter (Signed)
Hello,    Please call this pt to ask if she is breast feeding or on a special high protein diet. Her prolactin level is slightly elevated and may be explained if she says yes to any of the above. If the answer is no, she will need to come in for a repeat prolactin level while fasting only.    Thank you,  Shelda Pal  Patient made aware of results and the need for a repeat prolactin level fasting. Patient denies breastfeeding or special high protein diet. Patient informed that she will need to be fasting for this lab. Patient verbalized understanding.  Message sent to front desk to schedule lab only visit. Patient request appointment after 3 pm. Patient states that she does not have a problem with fasting because she works 7 am- 3pm

## 2022-07-20 ENCOUNTER — Other Ambulatory Visit: Payer: Self-pay

## 2022-07-21 ENCOUNTER — Other Ambulatory Visit: Payer: Commercial Managed Care - HMO

## 2022-07-21 DIAGNOSIS — R899 Unspecified abnormal finding in specimens from other organs, systems and tissues: Secondary | ICD-10-CM

## 2022-07-22 LAB — PROLACTIN: Prolactin: 34.7 ng/mL — ABNORMAL HIGH (ref 4.8–23.3)

## 2022-07-26 ENCOUNTER — Other Ambulatory Visit: Payer: Self-pay | Admitting: Family Medicine

## 2022-07-26 DIAGNOSIS — R7989 Other specified abnormal findings of blood chemistry: Secondary | ICD-10-CM

## 2022-07-26 DIAGNOSIS — N939 Abnormal uterine and vaginal bleeding, unspecified: Secondary | ICD-10-CM

## 2022-08-01 NOTE — Progress Notes (Signed)
TC to pt to inform of cont'd elevated prolactin levels and need for MRI. Pt verbalized understanding. Message sent to referral coordinator.

## 2022-08-04 ENCOUNTER — Other Ambulatory Visit: Payer: Self-pay

## 2022-08-04 ENCOUNTER — Other Ambulatory Visit: Payer: Self-pay | Admitting: Nurse Practitioner

## 2022-08-04 DIAGNOSIS — I10 Essential (primary) hypertension: Secondary | ICD-10-CM

## 2022-08-04 MED ORDER — AMLODIPINE BESYLATE 5 MG PO TABS
5.0000 mg | ORAL_TABLET | Freq: Every day | ORAL | 0 refills | Status: DC
  Filled 2022-08-04: qty 30, 30d supply, fill #0

## 2022-08-04 MED ORDER — LOSARTAN POTASSIUM 100 MG PO TABS
ORAL_TABLET | Freq: Every day | ORAL | 0 refills | Status: DC
  Filled 2022-08-04: qty 30, 30d supply, fill #0

## 2022-08-08 ENCOUNTER — Other Ambulatory Visit: Payer: Self-pay

## 2022-08-09 ENCOUNTER — Other Ambulatory Visit: Payer: Self-pay

## 2022-08-09 ENCOUNTER — Ambulatory Visit: Payer: Commercial Managed Care - HMO | Admitting: Obstetrics and Gynecology

## 2022-08-09 ENCOUNTER — Other Ambulatory Visit (HOSPITAL_COMMUNITY)
Admission: RE | Admit: 2022-08-09 | Discharge: 2022-08-09 | Disposition: A | Discharge status: Home / Self Care | Payer: Commercial Managed Care - HMO | Source: Ambulatory Visit | Attending: Obstetrics & Gynecology | Admitting: Obstetrics & Gynecology

## 2022-08-09 ENCOUNTER — Encounter: Payer: Self-pay | Admitting: Obstetrics and Gynecology

## 2022-08-09 VITALS — BP 154/94 | HR 95 | Wt 166.2 lb

## 2022-08-09 DIAGNOSIS — N939 Abnormal uterine and vaginal bleeding, unspecified: Secondary | ICD-10-CM | POA: Diagnosis present

## 2022-08-09 DIAGNOSIS — Z01812 Encounter for preprocedural laboratory examination: Secondary | ICD-10-CM | POA: Diagnosis not present

## 2022-08-09 DIAGNOSIS — R7989 Other specified abnormal findings of blood chemistry: Secondary | ICD-10-CM | POA: Diagnosis not present

## 2022-08-09 LAB — POCT URINE PREGNANCY: Preg Test, Ur: NEGATIVE

## 2022-08-09 MED ORDER — LORAZEPAM 0.5 MG PO TABS
0.5000 mg | ORAL_TABLET | Freq: Once | ORAL | 0 refills | Status: AC
  Filled 2022-08-09: qty 1, 1d supply, fill #0

## 2022-08-09 NOTE — Progress Notes (Signed)
Pt presents for Endometrial biopsy.  Elevated BP - pt admits did not take BP meds today.

## 2022-08-09 NOTE — Patient Instructions (Signed)
MRI head for elevated prolactin level

## 2022-08-09 NOTE — Progress Notes (Signed)
   Subjective:  CC: AUB, prolactinemia  Patient ID: Samantha Barr, female    DOB: [birth year], 41 y.o.   MRN: [id number]  HPI Pt presents today for EMB for AUB. Workup notable for elevated prolactin and pelvic ultrasound showing thickened EMS (though unclear when in her cycle the scan was completed).   Review of Systems    Objective:  BP (!) 154/94   Pulse 95   Wt 166 lb 3.2 oz (75.4 kg)   LMP 07/21/2022   BMI 30.89 kg/m   Physical Exam Vitals and nursing note reviewed. Exam conducted with a chaperone present.  Constitutional:      Appearance: Normal appearance.  HENT:     Head: Normocephalic and atraumatic.  Cardiovascular:     Rate and Rhythm: Normal rate and regular rhythm.  Pulmonary:     Effort: Pulmonary effort is normal.     Breath sounds: Normal breath sounds.  Genitourinary:    General: Normal vulva.     Exam position: Lithotomy position.     Labia:        Right: No rash.        Left: No rash.      Vagina: Normal.     Cervix: Friability present. No discharge or lesion.     Comments: Ectropion noted  No blood from external os Multiparous appearing cervix  Skin:    General: Skin is warm and dry.  Neurological:     General: No focal deficit present.     Mental Status: She is alert.  Psychiatric:        Mood and Affect: Mood normal.        Behavior: Behavior normal.        Thought Content: Thought content normal.        Judgment: Judgment normal.    Endometrial Biopsy Procedure  Patient identified, informed consent performed,  indication reviewed, consent signed.  Reviewed risk of perforation, pain, bleeding, insufficient sample, etc were reviewd. Time out was performed.  Urine pregnancy test negative.  Speculum placed in the vagina.  Cervix visualized.  Cleaned with Betadine x 2.  Anterior cervix infiltrated with 0.5cc of 1% lidocaine. Grasped anteriorly with a single tooth tenaculum.  Paracervical block was administered and the endocervical  canal instilled with anesthetic.  Endometrial pipelle was used to draw up 1cc of 1% lidocaine, introduced into the cervical os and instilled into the endometrial cavity.  The pipelle was passed 3x without difficulty and sample obtained. Tenaculum was removed, good hemostasis noted.  Patient tolerated procedure well.  Patient was given post-procedure instructions.     Assessment & Plan:   1. Abnormal uterine bleeding (AUB) Now s/p uncomplicated EMB, will follow up surgical pathology  - POCT urine pregnancy - LORazepam (ATIVAN) 0.5 MG tablet; Take 1 tablet (0.5 mg total) by mouth once for 1 dose.  Dispense: 1 tablet; Refill: 0  2. Elevated prolactin level MRI for elevated prolactin. Ativan for MRI provided for claustrophonia Will follow up results  Darliss Cheney, MD 08/09/22

## 2022-08-11 LAB — SURGICAL PATHOLOGY

## 2022-08-15 ENCOUNTER — Other Ambulatory Visit: Payer: Self-pay

## 2022-08-16 ENCOUNTER — Ambulatory Visit: Payer: Commercial Managed Care - HMO

## 2022-08-18 ENCOUNTER — Other Ambulatory Visit: Payer: Self-pay

## 2022-08-18 ENCOUNTER — Other Ambulatory Visit: Payer: Self-pay | Admitting: Obstetrics and Gynecology

## 2022-08-18 DIAGNOSIS — F419 Anxiety disorder, unspecified: Secondary | ICD-10-CM

## 2022-08-18 MED ORDER — LORAZEPAM 1 MG PO TABS
1.0000 mg | ORAL_TABLET | Freq: Three times a day (TID) | ORAL | 0 refills | Status: DC
  Filled 2022-08-18 – 2022-08-26 (×2): qty 1, 1d supply, fill #0

## 2022-08-24 ENCOUNTER — Other Ambulatory Visit: Payer: Self-pay

## 2022-08-26 ENCOUNTER — Other Ambulatory Visit: Payer: Self-pay

## 2022-08-28 IMAGING — MG DIGITAL SCREENING BILAT W/ CAD
5 series · 5 of 5 positions shown · non-contrast
Comparison: None.

CLINICAL DATA: Screening. Baseline.

EXAM:
DIGITAL SCREENING BILATERAL MAMMOGRAM WITH CAD
TECHNIQUE: Bilateral screening digital craniocaudal and mediolateral oblique
mammograms were obtained. The images were evaluated with
computer-aided detection.

[L CC]
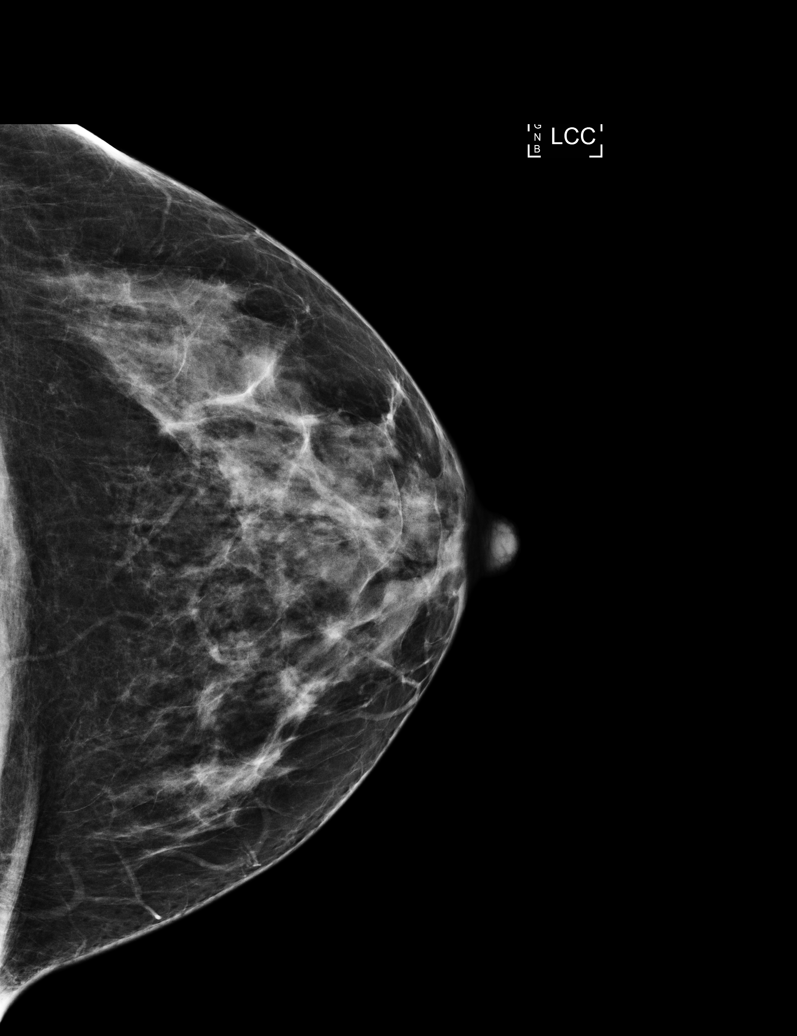

[R MLO]
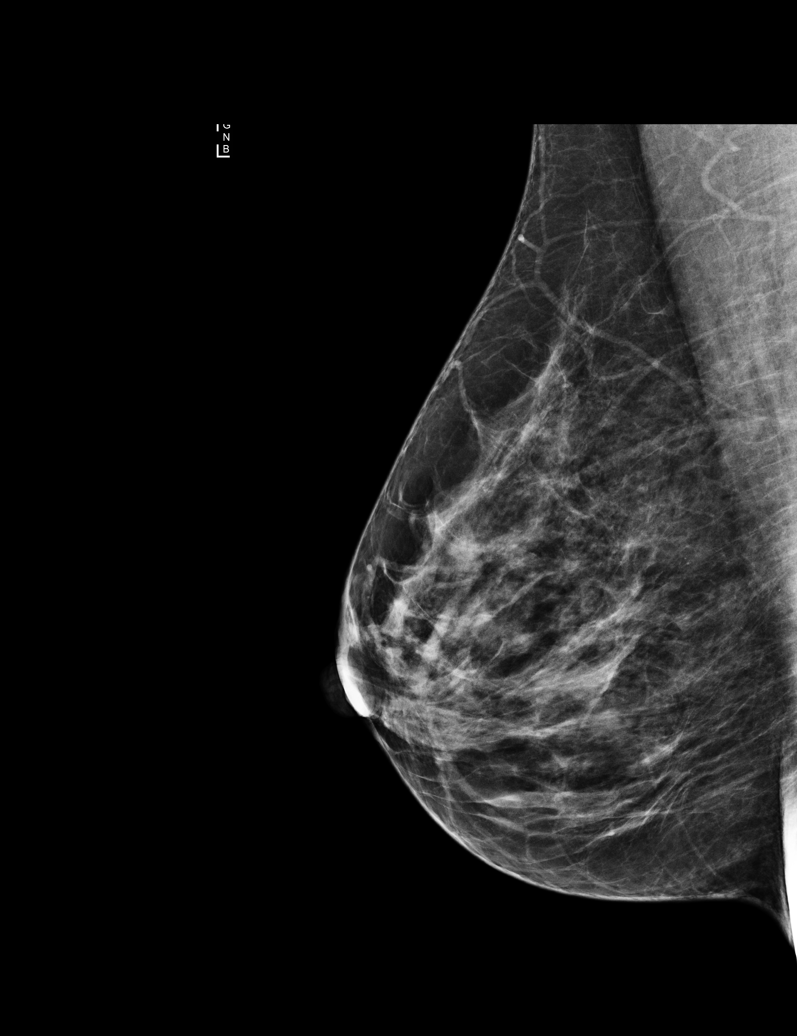

[R CC (1 of 2)]
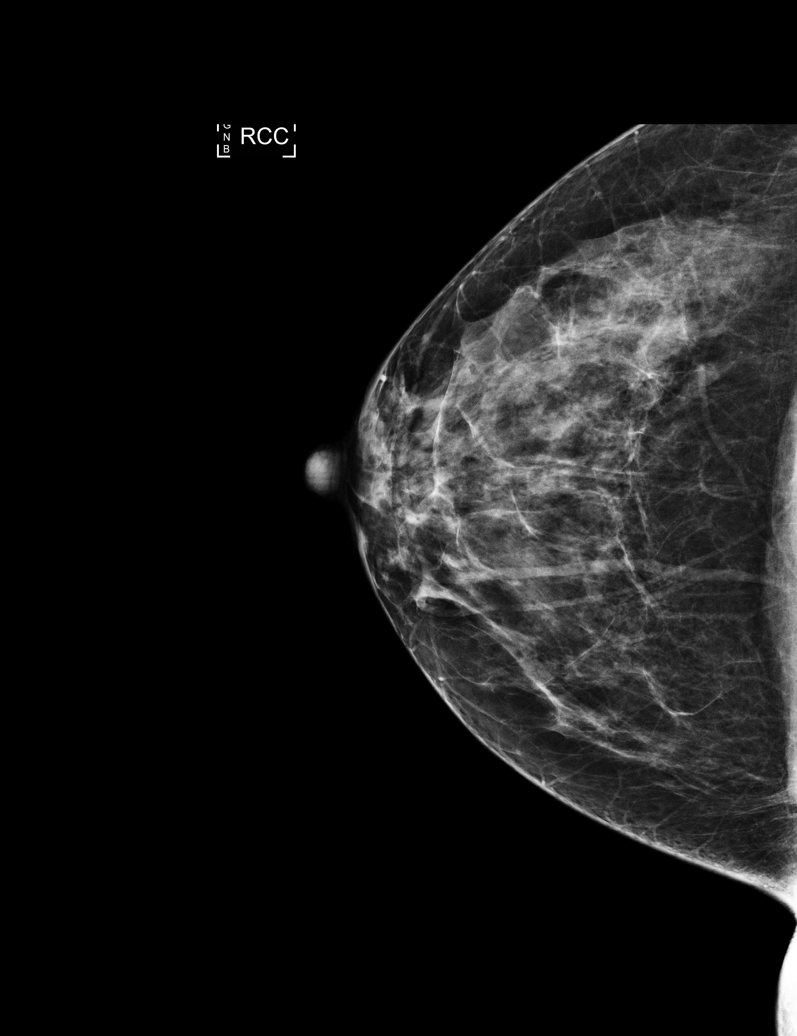

[R CC (2 of 2)]
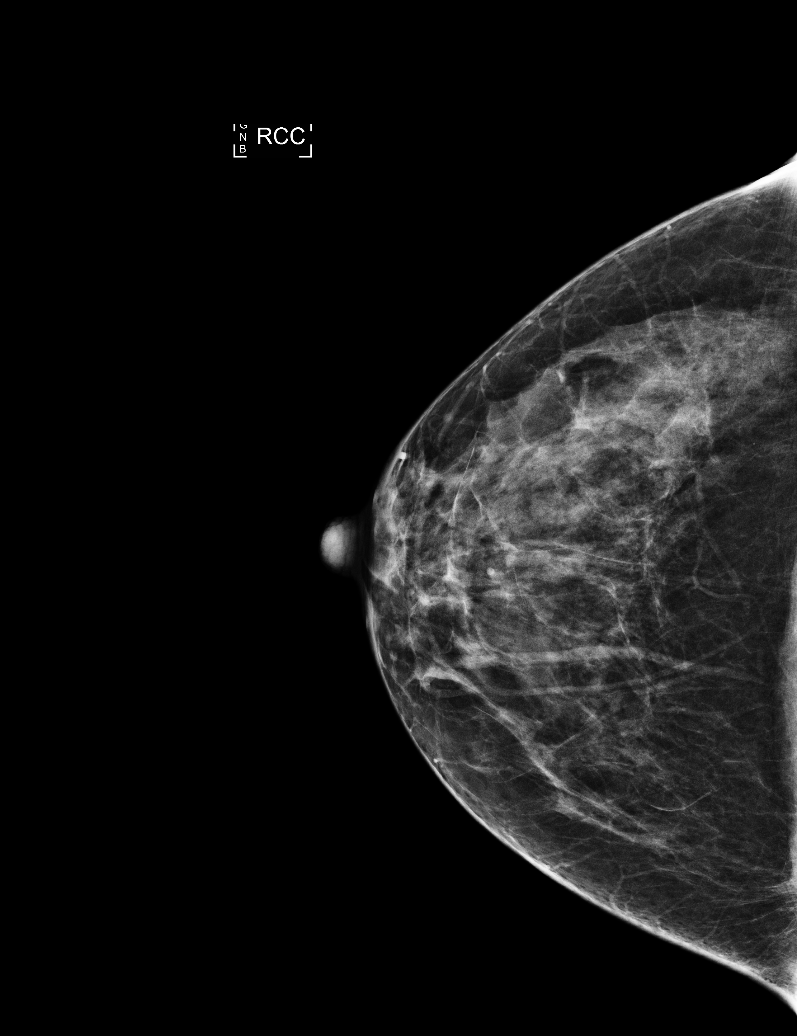

[L MLO]
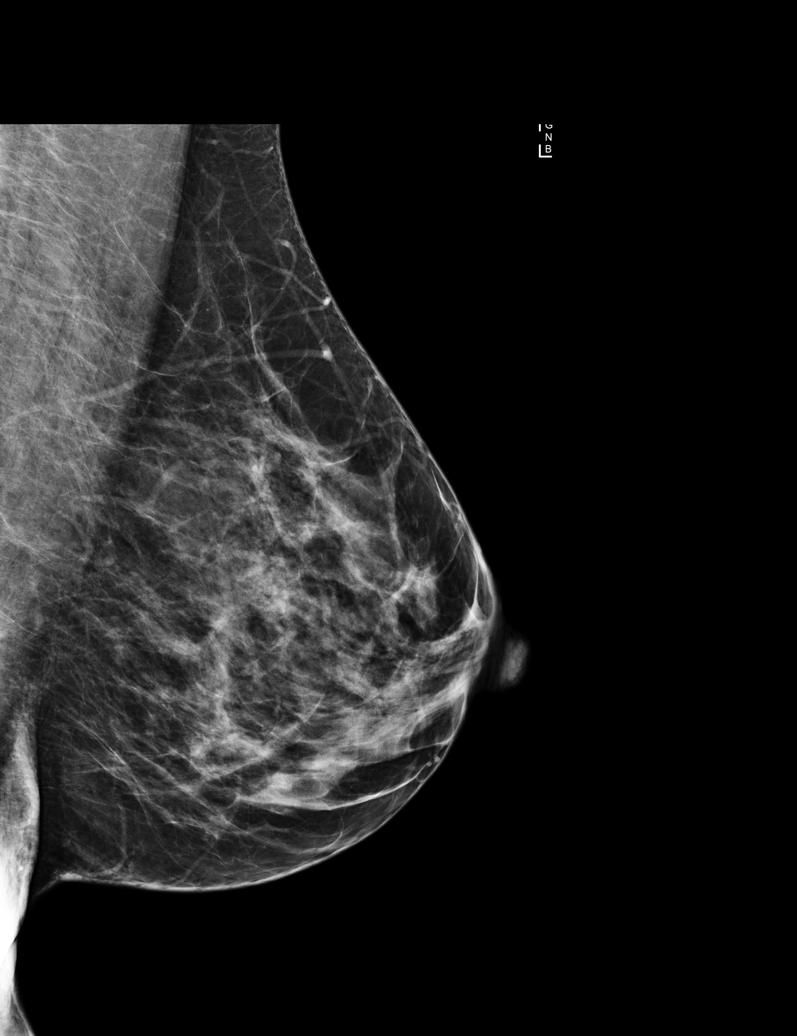

[5 of 5 positions shown; findings below may reference images not displayed]

ACR Breast Density Category c: The breast tissue is heterogeneously
dense, which may obscure small masses
FINDINGS: There are no findings suspicious for malignancy.
IMPRESSION: No mammographic evidence of malignancy. A result letter of this
screening mammogram will be mailed directly to the patient.

RECOMMENDATION:
Screening mammogram in one year. (Code:61-2-564)

BI-RADS CATEGORY  1: Negative.

## 2022-08-29 ENCOUNTER — Ambulatory Visit (INDEPENDENT_AMBULATORY_CARE_PROVIDER_SITE_OTHER): Payer: Commercial Managed Care - HMO

## 2022-08-29 DIAGNOSIS — R7989 Other specified abnormal findings of blood chemistry: Secondary | ICD-10-CM | POA: Diagnosis not present

## 2022-08-29 MED ORDER — GADOBUTROL 1 MMOL/ML IV SOLN
7.5000 mL | Freq: Once | INTRAVENOUS | Status: AC | PRN
  Administered 2022-08-29: 7.5 mL via INTRAVENOUS

## 2022-09-26 ENCOUNTER — Ambulatory Visit: Payer: Commercial Managed Care - HMO | Admitting: Obstetrics and Gynecology

## 2022-09-29 ENCOUNTER — Encounter: Payer: Self-pay | Admitting: Obstetrics and Gynecology

## 2022-09-29 ENCOUNTER — Ambulatory Visit: Payer: Commercial Managed Care - HMO | Admitting: Obstetrics and Gynecology

## 2022-09-29 VITALS — BP 126/85 | HR 82 | Ht 65.0 in | Wt 167.0 lb

## 2022-09-29 DIAGNOSIS — N938 Other specified abnormal uterine and vaginal bleeding: Secondary | ICD-10-CM

## 2022-09-29 DIAGNOSIS — Z712 Person consulting for explanation of examination or test findings: Secondary | ICD-10-CM

## 2022-09-29 NOTE — Progress Notes (Signed)
GYN presents for FU.

## 2022-09-29 NOTE — Progress Notes (Signed)
41 yo P4 here to discuss test results. 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 Patient reports a normal period since her last appointment here. She reports a period every 24- 25 days, lasting 5 days. She is not currently trying to conceive. She is without any complaints  Past Medical History:  Diagnosis Date   Hypertension    Renal disorder    No past surgical history on file. Family History  Problem Relation Age of Onset   Diabetes Mother    Breast cancer Father    Hypertension Maternal Aunt    Social History   Tobacco Use   Smoking status: Never    Passive exposure: Never   Smokeless tobacco: Never  Vaping Use   Vaping Use: Never used  Substance Use Topics   Alcohol use: No   Drug use: No   ROS See pertinent in HPI. All other systems reviewed and non contributory Blood pressure 126/85, pulse 82, height '5\' 5"'$  (1.651 m), weight 167 lb (75.8 kg), last menstrual period 09/28/2022. GENERAL: Well-developed, well-nourished female in no acute distress.  NEURO: alert and oriented x 3  08/09/22 endometrial biopsy Negative for malignancy  MR BRAIN W WO CONTRAST  Result Date: 08/31/2022 CLINICAL DATA:  Elevated prolactin level. Rule out pituitary lesion. EXAM: MRI HEAD WITHOUT AND WITH CONTRAST TECHNIQUE: Multiplanar, multiecho pulse sequences of the brain and surrounding structures were obtained without and with intravenous contrast. CONTRAST:  7.46m GADAVIST GADOBUTROL 1 MMOL/ML IV SOLN COMPARISON:  None Available. FINDINGS: Brain: Dynamic pituitary protocol. Pituitary normal in size. 3 x 6 mm hyperintensity in the posterior pituitary gland. Anterior pituitary normal. Pituitary otherwise enhances homogeneously. Infundibulum midline. Optic chiasm normal. Cavernous sinus normal bilaterally Ventricle size and cerebral volume normal. Mild white matter changes with scattered small white matter hyperintensities bilaterally. Negative for acute  infarct, hemorrhage, mass Vascular: Skull and upper cervical spine: No focal skeletal lesion Sinuses/Orbits: Paranasal sinuses clear.  Negative orbit Other: Normal arterial flow voids none IMPRESSION: 1. 3 x 6 mm hyperintensity in the posterior pituitary gland. 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 2. Mild white matter changes. Correlate with risk factors for small vessel ischemia. Electronically Signed   By: CFranchot GalloM.D.   On: 08/31/2022 14:23     A/P 41yo here for results - reviewed normal results - Reassurance provided - No further testing needed - Patient current on pap smear and mammography

## 2022-12-05 ENCOUNTER — Ambulatory Visit: Payer: Self-pay | Attending: Nurse Practitioner | Admitting: Nurse Practitioner

## 2022-12-05 ENCOUNTER — Other Ambulatory Visit: Payer: Self-pay

## 2022-12-05 ENCOUNTER — Encounter: Payer: Self-pay | Admitting: Nurse Practitioner

## 2022-12-05 VITALS — BP 118/84 | HR 94 | Ht 65.0 in | Wt 165.0 lb

## 2022-12-05 DIAGNOSIS — R519 Headache, unspecified: Secondary | ICD-10-CM

## 2022-12-05 DIAGNOSIS — I10 Essential (primary) hypertension: Secondary | ICD-10-CM

## 2022-12-05 DIAGNOSIS — F5101 Primary insomnia: Secondary | ICD-10-CM

## 2022-12-05 DIAGNOSIS — R7989 Other specified abnormal findings of blood chemistry: Secondary | ICD-10-CM

## 2022-12-05 MED ORDER — LOSARTAN POTASSIUM 100 MG PO TABS
100.0000 mg | ORAL_TABLET | Freq: Every day | ORAL | 0 refills | Status: DC
  Filled 2022-12-05: qty 30, 30d supply, fill #0

## 2022-12-05 MED ORDER — AMLODIPINE BESYLATE 5 MG PO TABS
5.0000 mg | ORAL_TABLET | Freq: Every day | ORAL | 0 refills | Status: DC
  Filled 2022-12-05: qty 30, 30d supply, fill #0

## 2022-12-05 MED ORDER — TOPIRAMATE 50 MG PO TABS
25.0000 mg | ORAL_TABLET | Freq: Every day | ORAL | 1 refills | Status: DC
  Filled 2022-12-05: qty 30, 30d supply, fill #0

## 2022-12-05 NOTE — Patient Instructions (Signed)
Melatonin up to 10 mg every night

## 2022-12-05 NOTE — Progress Notes (Signed)
Assessment & Plan:  Samantha Barr was seen today for hypertension and headache.  Diagnoses and all orders for this visit:  Essential hypertension -     amLODipine (NORVASC) 5 MG tablet; Take 1 tablet (5 mg total) by mouth daily. -     losartan (COZAAR) 100 MG tablet; Take 1 tablet (100 mg total) by mouth daily. Continue all antihypertensives as prescribed.  Reminded to bring in blood pressure log for follow  up appointment.  RECOMMENDATIONS: DASH/Mediterranean Diets are healthier choices for HTN.     Frequent headaches -     CBC with Differential -     Thyroid Panel With TSH -     topiramate (TOPAMAX) 50 MG tablet; Take 0.5-1 tablets (25-50 mg total) by mouth daily for headaches  Primary Insomnia Recommend melatonin or sleepy time tea   Abnormal TSH -     Thyroid Panel With TSH    Patient has been counseled on age-appropriate routine health concerns for screening and prevention. These are reviewed and up-to-date. Referrals have been placed accordingly. Immunizations are up-to-date or declined.    Subjective:   Chief Complaint  Patient presents with   Hypertension   Headache   HPI Samantha Barr 42 y.o. female presents to office today for follow up to HTN. She also endorses insomnia and frequent headaches.    HTN Blood pressure is controlled today. She is taking amlodipine 5 mg daily and losartan 100 mg daily.  BP Readings from Last 3 Encounters:  12/05/22 118/84  09/29/22 126/85  08/09/22 (!) 154/94     Tension HA Onset 2-3 weeks ago. Headaches are temporal (bilaterally) and occurring every day. Associated symptoms: lightheadedness, tension in neck. She denies visual changes, N/V. She does not consume any significant amount of caffeine (1 cup of tea in the mornings).    Insomnia Having difficulty staying asleep throughout the night. Onset of insomnia a few months ago.    Review of Systems  Constitutional:  Negative for fever, malaise/fatigue and  weight loss.  HENT: Negative.  Negative for nosebleeds.   Eyes: Negative.  Negative for blurred vision, double vision and photophobia.  Respiratory: Negative.  Negative for cough and shortness of breath.   Cardiovascular: Negative.  Negative for chest pain, palpitations and leg swelling.  Gastrointestinal: Negative.  Negative for heartburn, nausea and vomiting.  Musculoskeletal: Negative.  Negative for myalgias.  Neurological:  Positive for headaches. Negative for dizziness, focal weakness and seizures.  Psychiatric/Behavioral:  Negative for suicidal ideas. The patient has insomnia.     Past Medical History:  Diagnosis Date   Hypertension    Renal disorder     History reviewed. No pertinent surgical history.  Family History  Problem Relation Age of Onset   Diabetes Mother    Breast cancer Father    Hypertension Maternal Aunt     Social History Reviewed with no changes to be made today.   Outpatient Medications Prior to Visit  Medication Sig Dispense Refill   rosuvastatin (CRESTOR) 20 MG tablet Take 1 tablet (20 mg total) by mouth daily. 90 tablet 3   amLODipine (NORVASC) 5 MG tablet TAKE 1 TABLET (5 MG TOTAL) BY MOUTH DAILY. 30 tablet 0   losartan (COZAAR) 100 MG tablet TAKE 1 TABLET (100 MG TOTAL) BY MOUTH DAILY. 30 tablet 0   Budesonide (TARPEYO) 4 MG CPDR Take 4 mg by mouth daily at 8 pm.     ferrous gluconate (FERGON) 324 MG tablet TAKE 1 TABLET (324 MG  TOTAL) BY MOUTH DAILY WITH BREAKFAST. (Patient not taking: Reported on 11/01/2021) 90 tablet 3   LORazepam (ATIVAN) 1 MG tablet Take 1 tablet (1 mg total) by mouth once 1 tablet 0   No facility-administered medications prior to visit.    Allergies  Allergen Reactions   Gabapentin     DIZZINESS       Objective:    BP 118/84   Pulse 94   Ht '5\' 5"'$  (1.651 m)   Wt 165 lb (74.8 kg)   LMP 11/21/2022   SpO2 99%   BMI 27.46 kg/m  Wt Readings from Last 3 Encounters:  12/05/22 165 lb (74.8 kg)  09/29/22 167 lb (75.8  kg)  08/09/22 166 lb 3.2 oz (75.4 kg)    Physical Exam Vitals and nursing note reviewed.  Constitutional:      Appearance: She is well-developed.  HENT:     Head: Normocephalic and atraumatic.  Cardiovascular:     Rate and Rhythm: Normal rate and regular rhythm.     Heart sounds: Normal heart sounds. No murmur heard.    No friction rub. No gallop.  Pulmonary:     Effort: Pulmonary effort is normal. No tachypnea or respiratory distress.     Breath sounds: Normal breath sounds. No decreased breath sounds, wheezing, rhonchi or rales.  Chest:     Chest wall: No tenderness.  Abdominal:     General: Bowel sounds are normal.     Palpations: Abdomen is soft.  Musculoskeletal:        General: Normal range of motion.     Cervical back: Normal range of motion.  Skin:    General: Skin is warm and dry.  Neurological:     Mental Status: She is alert and oriented to person, place, and time.     Coordination: Coordination normal.  Psychiatric:        Behavior: Behavior normal. Behavior is cooperative.        Thought Content: Thought content normal.        Judgment: Judgment normal.          Patient has been counseled extensively about nutrition and exercise as well as the importance of adherence with medications and regular follow-up. The patient was given clear instructions to go to ER or return to medical center if symptoms don't improve, worsen or new problems develop. The patient verbalized understanding.   Follow-up: Return in about 3 weeks (around 12/26/2022) for virtual visit 2-28 at 1110 or 130 doublebook for headaches.Gildardo Pounds, FNP-BC [facility] and [facility] [place], [place]   12/05/2022, 9:34 PM

## 2022-12-05 NOTE — Progress Notes (Signed)
Trouble staying asleep.

## 2022-12-06 LAB — CBC WITH DIFFERENTIAL/PLATELET
Basophils Absolute: 0.1 10*3/uL (ref 0.0–0.2)
Basos: 1 %
EOS (ABSOLUTE): 0.2 10*3/uL (ref 0.0–0.4)
Eos: 3 %
Hematocrit: 37.2 % (ref 34.0–46.6)
Hemoglobin: 11.5 g/dL (ref 11.1–15.9)
Immature Grans (Abs): 0 10*3/uL (ref 0.0–0.1)
Immature Granulocytes: 0 %
Lymphocytes Absolute: 1.3 10*3/uL (ref 0.7–3.1)
Lymphs: 16 %
MCH: 25.8 pg — ABNORMAL LOW (ref 26.6–33.0)
MCHC: 30.9 g/dL — ABNORMAL LOW (ref 31.5–35.7)
MCV: 84 fL (ref 79–97)
Monocytes Absolute: 0.5 10*3/uL (ref 0.1–0.9)
Monocytes: 6 %
Neutrophils Absolute: 5.9 10*3/uL (ref 1.4–7.0)
Neutrophils: 74 %
Platelets: 242 10*3/uL (ref 150–450)
RBC: 4.45 x10E6/uL (ref 3.77–5.28)
RDW: 15.2 % (ref 11.7–15.4)
WBC: 8 10*3/uL (ref 3.4–10.8)

## 2022-12-06 LAB — THYROID PANEL WITH TSH
Free Thyroxine Index: 2 (ref 1.2–4.9)
T3 Uptake Ratio: 29 % (ref 24–39)
T4, Total: 7 ug/dL (ref 4.5–12.0)
TSH: 0.565 u[IU]/mL (ref 0.450–4.500)

## 2022-12-07 ENCOUNTER — Other Ambulatory Visit: Payer: Self-pay

## 2022-12-28 ENCOUNTER — Ambulatory Visit: Payer: Self-pay | Admitting: Nurse Practitioner

## 2023-02-22 ENCOUNTER — Other Ambulatory Visit: Payer: Self-pay

## 2023-02-22 MED ORDER — FILSPARI 400 MG PO TABS: 400.0000 mg | ORAL_TABLET | Freq: Every day | ORAL | 3 refills | Status: AC

## 2023-03-30 ENCOUNTER — Other Ambulatory Visit: Payer: Self-pay

## 2023-03-30 MED ORDER — PAROXETINE HCL 10 MG PO TABS
10.0000 mg | ORAL_TABLET | Freq: Every day | ORAL | 11 refills | Status: DC
  Filled 2023-03-30: qty 30, 30d supply, fill #0

## 2023-05-03 LAB — HM MAMMOGRAPHY

## 2023-08-14 ENCOUNTER — Other Ambulatory Visit (HOSPITAL_COMMUNITY): Payer: Self-pay

## 2023-11-07 ENCOUNTER — Other Ambulatory Visit (HOSPITAL_COMMUNITY): Payer: Self-pay | Admitting: *Deleted

## 2023-11-08 ENCOUNTER — Encounter (HOSPITAL_COMMUNITY)
Admission: RE | Admit: 2023-11-08 | Discharge: 2023-11-08 | Disposition: A | Discharge status: Home / Self Care | Payer: 59 | Source: Ambulatory Visit | Attending: Nephrology | Admitting: Nephrology

## 2023-11-08 DIAGNOSIS — D631 Anemia in chronic kidney disease: Secondary | ICD-10-CM | POA: Diagnosis not present

## 2023-11-08 DIAGNOSIS — N189 Chronic kidney disease, unspecified: Secondary | ICD-10-CM | POA: Insufficient documentation

## 2023-11-08 MED ORDER — SODIUM CHLORIDE 0.9 % IV SOLN
510.0000 mg | INTRAVENOUS | Status: DC
  Administered 2023-11-08: 510 mg via INTRAVENOUS
  Filled 2023-11-08: qty 510

## 2023-11-15 ENCOUNTER — Ambulatory Visit (HOSPITAL_COMMUNITY)
Admission: RE | Admit: 2023-11-15 | Discharge: 2023-11-15 | Disposition: A | Discharge status: Home / Self Care | Payer: 59 | Source: Ambulatory Visit | Attending: Nephrology | Admitting: Nephrology

## 2023-11-15 ENCOUNTER — Encounter (HOSPITAL_COMMUNITY): Payer: Self-pay

## 2023-11-15 DIAGNOSIS — N189 Chronic kidney disease, unspecified: Secondary | ICD-10-CM | POA: Insufficient documentation

## 2023-11-15 DIAGNOSIS — D631 Anemia in chronic kidney disease: Secondary | ICD-10-CM | POA: Insufficient documentation

## 2023-11-15 MED ORDER — SODIUM CHLORIDE 0.9 % IV SOLN
510.0000 mg | INTRAVENOUS | Status: AC
  Administered 2023-11-15: 510 mg via INTRAVENOUS
  Filled 2023-11-15: qty 510

## 2024-02-14 ENCOUNTER — Encounter: Payer: Self-pay | Admitting: Nurse Practitioner

## 2024-02-14 ENCOUNTER — Other Ambulatory Visit: Payer: Self-pay

## 2024-02-14 ENCOUNTER — Ambulatory Visit: Attending: Nurse Practitioner | Admitting: Nurse Practitioner

## 2024-02-14 VITALS — BP 116/80 | HR 84 | Resp 19 | Ht 65.0 in | Wt 161.0 lb

## 2024-02-14 DIAGNOSIS — K219 Gastro-esophageal reflux disease without esophagitis: Secondary | ICD-10-CM | POA: Diagnosis not present

## 2024-02-14 DIAGNOSIS — E782 Mixed hyperlipidemia: Secondary | ICD-10-CM | POA: Diagnosis not present

## 2024-02-14 DIAGNOSIS — E559 Vitamin D deficiency, unspecified: Secondary | ICD-10-CM

## 2024-02-14 DIAGNOSIS — M546 Pain in thoracic spine: Secondary | ICD-10-CM

## 2024-02-14 DIAGNOSIS — I10 Essential (primary) hypertension: Secondary | ICD-10-CM

## 2024-02-14 MED ORDER — OMEPRAZOLE 20 MG PO CPDR
20.0000 mg | DELAYED_RELEASE_CAPSULE | Freq: Every day | ORAL | 3 refills | Status: DC
  Filled 2024-02-14: qty 30, 30d supply, fill #0

## 2024-02-14 NOTE — Progress Notes (Signed)
 Assessment & Plan:  Samantha Barr was seen today for back pain.  Diagnoses and all orders for this visit:  Essential hypertension Recommendation: DASH/Mediterranean Diet: monitor sodium intake.  Exercise 3-5 times per week for at least 30 minutes per day.   BP Readings from Last 3 Encounters:  02/14/24 116/80  11/15/23 113/88  11/08/23 131/85     Mixed hyperlipidemia -     Lipid Panel  Vitamin D deficiency -     VITAMIN D 25 Hydroxy (Vit-D Deficiency, Fractures)  GERD without esophagitis -     omeprazole (PRILOSEC) 20 MG capsule; Take 1 capsule (20 mg total) by mouth daily. Educational material provided on AVS about foods to avoid to reduce reflux symptoms   Midline thoracic back pain, unspecified chronicity -     Ambulatory referral to Physical Therapy Continue Tylenol as needed for pain     Patient has been counseled on age-appropriate routine health concerns for screening and prevention. These are reviewed and up-to-date. Referrals have been placed accordingly. Immunizations are up-to-date or declined.    Subjective:   Chief Complaint  Patient presents with   Back Pain    Samantha Barr 43 y.o. female presents to office today for routine follow up on HTN and has complaints of pain in mid-back.not relieved with Tylenol over-the-counter pain gels. Reports aching and burning is associated with eating, the pain worsens at night and upon awakening. Reports drinking carbonated sodas and traditional spicy cuisine. Educated importance of drinking water and limiting dark carbonated colas as may worsen kidney function. Provided patient with information on eating plan for CKD and GERD.   Patient had follow up with Nephrologist, Dr Allena Katz on 02/08/2024. Patient blood pressure medications were discontinued. Her blood pressure has been stable. She checks her blood pressure at home.    Back Pain This is a recurrent problem. The current episode started more than 1 month  ago. The problem occurs intermittently. The problem has been waxing and waning since onset. The pain is present in the thoracic spine. The quality of the pain is described as aching and burning. The pain is at a severity of 6/10. The pain is moderate. The pain is The same all the time. Exacerbated by: none. Stiffness is present All day. (Lower epigastric pain) Risk factors include lack of exercise, sedentary lifestyle and poor posture. She has tried analgesics (pain creams or gels) for the symptoms. The treatment provided no relief.   Patient states she does not exercise. Referral to physical therapy for strengthening exercises,  stretching, improve flexibility, and retrain the body for proper alignment.    Review of Systems  Constitutional: Negative.   HENT: Negative.    Eyes: Negative.   Respiratory: Negative.    Cardiovascular: Negative.   Gastrointestinal:  Positive for heartburn.  Musculoskeletal:  Positive for back pain.  Skin: Negative.   Neurological: Negative.   Endo/Heme/Allergies: Negative.   Psychiatric/Behavioral: Negative.      Past Medical History:  Diagnosis Date   Hypertension    Renal disorder     No past surgical history on file.  Family History  Problem Relation Age of Onset   Diabetes Mother    Breast cancer Father    Hypertension Maternal Aunt     Social History Reviewed with no changes to be made today.   Outpatient Medications Prior to Visit  Medication Sig Dispense Refill   Sparsentan (FILSPARI) 400 MG TABS Take 400 mg (1 tablet) by mouth daily. 90 tablet 3  amLODipine (NORVASC) 5 MG tablet Take 1 tablet (5 mg total) by mouth daily. (Patient not taking: Reported on 02/14/2024) 30 tablet 0   Budesonide (TARPEYO) 4 MG CPDR Take 4 mg by mouth daily at 8 pm. (Patient not taking: Reported on 02/14/2024)     ferrous gluconate (FERGON) 324 MG tablet TAKE 1 TABLET (324 MG TOTAL) BY MOUTH DAILY WITH BREAKFAST. (Patient not taking: Reported on 11/01/2021) 90  tablet 3   LORazepam (ATIVAN) 1 MG tablet Take 1 tablet (1 mg total) by mouth once (Patient not taking: Reported on 02/14/2024) 1 tablet 0   losartan (COZAAR) 100 MG tablet Take 1 tablet (100 mg total) by mouth daily. (Patient not taking: Reported on 02/14/2024) 30 tablet 0   PARoxetine (PAXIL) 10 MG tablet Take 1 tablet (10 mg total) by mouth daily. (Patient not taking: Reported on 02/14/2024) 30 tablet 11   rosuvastatin (CRESTOR) 20 MG tablet Take 1 tablet (20 mg total) by mouth daily. (Patient not taking: Reported on 02/14/2024) 90 tablet 3   topiramate (TOPAMAX) 50 MG tablet Take 0.5-1 tablets (25-50 mg total) by mouth daily for headaches (Patient not taking: Reported on 02/14/2024) 30 tablet 1   No facility-administered medications prior to visit.    Allergies  Allergen Reactions   Gabapentin     DIZZINESS       Objective:    BP 116/80 (BP Location: Left Arm, Patient Position: Sitting, Cuff Size: Normal)   Pulse 84   Resp 19   Ht 5\' 5"  (1.651 m)   Wt 161 lb (73 kg)   LMP 02/02/2024 (Exact Date)   SpO2 100%   BMI 26.79 kg/m  Wt Readings from Last 3 Encounters:  02/14/24 161 lb (73 kg)  11/15/23 153 lb (69.4 kg)  11/08/23 153 lb (69.4 kg)    Physical Exam Constitutional:      Appearance: Normal appearance.  HENT:     Head: Normocephalic.  Cardiovascular:     Rate and Rhythm: Normal rate and regular rhythm.     Heart sounds: Normal heart sounds.  Pulmonary:     Effort: Pulmonary effort is normal.     Breath sounds: Normal breath sounds.  Abdominal:     General: Abdomen is flat. Bowel sounds are normal.     Palpations: Abdomen is soft. There is no mass.     Tenderness: There is abdominal tenderness in the epigastric area. There is no right CVA tenderness or left CVA tenderness. Negative signs include Murphy's sign.    Musculoskeletal:        General: Normal range of motion.     Cervical back: Normal, normal range of motion and neck supple.     Thoracic back:  Tenderness present.     Lumbar back: Normal.       Back:  Skin:    General: Skin is warm and dry.  Neurological:     Mental Status: She is alert.  Psychiatric:        Attention and Perception: Attention and perception normal.        Mood and Affect: Mood normal.        Speech: Speech normal.        Behavior: Behavior normal.        Thought Content: Thought content normal.        Cognition and Memory: Cognition normal.        Judgment: Judgment normal.         Patient has been counseled extensively about  nutrition and exercise as well as the importance of adherence with medications and regular follow-up. The patient was given clear instructions to go to ER or return to medical center if symptoms don't improve, worsen or new problems develop. The patient verbalized understanding.   Follow-up: Return in about 6 months (around 08/15/2024).   Aicha Clingenpeel E Emberleigh Reily, RN BSN, student FNP [facility] and [facility] [place], [place] [phone]   02/14/2024, 9:55 AM

## 2024-02-14 NOTE — Progress Notes (Signed)
 I have seen and examined this patient with the advanced practice provider STUDENT and agree with the note below

## 2024-02-15 ENCOUNTER — Other Ambulatory Visit: Payer: Self-pay

## 2024-02-15 ENCOUNTER — Other Ambulatory Visit: Payer: Self-pay | Admitting: Nurse Practitioner

## 2024-02-15 DIAGNOSIS — E785 Hyperlipidemia, unspecified: Secondary | ICD-10-CM

## 2024-02-15 LAB — LIPID PANEL
Chol/HDL Ratio: 6.3 ratio — ABNORMAL HIGH (ref 0.0–4.4)
Cholesterol, Total: 213 mg/dL — ABNORMAL HIGH (ref 100–199)
HDL: 34 mg/dL — ABNORMAL LOW (ref >39)
LDL Chol Calc (NIH): 113 mg/dL — ABNORMAL HIGH (ref 0–99)
Triglycerides: 379 mg/dL — ABNORMAL HIGH (ref 0–149)
VLDL Cholesterol Cal: 66 mg/dL — ABNORMAL HIGH (ref 5–40)

## 2024-02-15 LAB — VITAMIN D 25 HYDROXY (VIT D DEFICIENCY, FRACTURES): Vit D, 25-Hydroxy: 22.5 ng/mL — ABNORMAL LOW (ref 30.0–100.0)

## 2024-02-16 ENCOUNTER — Other Ambulatory Visit: Payer: Self-pay

## 2024-02-16 MED ORDER — ROSUVASTATIN CALCIUM 20 MG PO TABS
20.0000 mg | ORAL_TABLET | Freq: Every day | ORAL | 3 refills | Status: AC
  Filled 2024-02-16: qty 90, 90d supply, fill #0
  Filled 2024-09-18: qty 90, 90d supply, fill #1

## 2024-02-20 ENCOUNTER — Other Ambulatory Visit: Payer: Self-pay

## 2024-02-20 ENCOUNTER — Encounter: Payer: Self-pay | Admitting: Nurse Practitioner

## 2024-02-20 ENCOUNTER — Other Ambulatory Visit: Payer: Self-pay | Admitting: Nurse Practitioner

## 2024-02-20 DIAGNOSIS — E782 Mixed hyperlipidemia: Secondary | ICD-10-CM

## 2024-03-13 ENCOUNTER — Encounter (HOSPITAL_BASED_OUTPATIENT_CLINIC_OR_DEPARTMENT_OTHER): Payer: Self-pay | Admitting: Physical Therapy

## 2024-03-13 ENCOUNTER — Ambulatory Visit (HOSPITAL_BASED_OUTPATIENT_CLINIC_OR_DEPARTMENT_OTHER): Attending: Nurse Practitioner | Admitting: Physical Therapy

## 2024-03-13 ENCOUNTER — Other Ambulatory Visit: Payer: Self-pay

## 2024-03-13 DIAGNOSIS — R29898 Other symptoms and signs involving the musculoskeletal system: Secondary | ICD-10-CM | POA: Insufficient documentation

## 2024-03-13 DIAGNOSIS — M6281 Muscle weakness (generalized): Secondary | ICD-10-CM | POA: Diagnosis present

## 2024-03-13 DIAGNOSIS — R2689 Other abnormalities of gait and mobility: Secondary | ICD-10-CM | POA: Diagnosis present

## 2024-03-13 DIAGNOSIS — M546 Pain in thoracic spine: Secondary | ICD-10-CM | POA: Diagnosis present

## 2024-03-13 NOTE — Therapy (Signed)
 OUTPATIENT PHYSICAL THERAPY THORACOLUMBAR EVALUATION   Patient Name: Samantha Barr MRN: [id number] DOB:1980-11-25, 43 y.o., female Today's Date: 03/13/2024  END OF SESSION:  PT End of Session - 03/13/24 1351     Visit Number 1    Number of Visits 16    Date for PT Re-Evaluation 05/08/24    Authorization Type [employer]    PT Start Time 1352    PT Stop Time 1428    PT Time Calculation (min) 36 min    Activity Tolerance Patient tolerated treatment well    Behavior During Therapy WFL for tasks assessed/performed             Past Medical History:  Diagnosis Date   Hypertension    Renal disorder    History reviewed. No pertinent surgical history. Patient Active Problem List   Diagnosis Date Noted   Abnormal uterine bleeding (AUB) 06/09/2022   Essential hypertension 03/05/2018   Stage 3 chronic kidney disease (HCC) 03/05/2018    PCP: Collins Dean, NP  REFERRING PROVIDER: Collins Dean, NP  REFERRING DIAG: M54.6 (ICD-10-CM) - Midline thoracic back pain, unspecified chronicity  Rationale for Evaluation and Treatment: Rehabilitation  THERAPY DIAG:  Pain in thoracic spine  Muscle weakness (generalized)  Other abnormalities of gait and mobility  Other symptoms and signs involving the musculoskeletal system  ONSET DATE: 2-3 months  SUBJECTIVE:                                                                                                                                                                                           SUBJECTIVE STATEMENT: Patient states mid back pain that began about 2-3 months ago with insidious onset. Has tried topicals like icy hot with short term relief. No hx of back pain. Standing seems to aggravate symptoms. Pain increases after 30 minutes of standing.   PERTINENT HISTORY:  CKD, HLD  PAIN:  Are you having pain? Yes: NPRS scale: 4/10 Pain location: thoracic spine Pain description: tight Aggravating  factors: standing Relieving factors: short term relief icy hot  PRECAUTIONS: None  WEIGHT BEARING RESTRICTIONS: No  FALLS:  Has patient fallen in last 6 months? No   PLOF: Independent  PATIENT GOALS: help improve the pain   OBJECTIVE: (objective measures from initial evaluation unless otherwise dated)  PATIENT SURVEYS: Modified Oswestry 14/50   COGNITION: Overall cognitive status: Within functional limits for tasks assessed     SENSATION: WFL   POSTURE: increased thoracic kyphosis  PALPATION: TTP thoracic paraspinals bilaterally but L>R; hypomobile thoracic spine, hypermobile and tender lumbar spine  THORACOLUMBAR ROM:   AROM eval  Flexion  0% limited  Extension 0% limited  Right lateral flexion   Left lateral flexion   Right rotation   Left rotation    (Blank rows = not tested) * = pain/symptoms    LOWER EXTREMITY MMT:    MMT Right eval Left eval  Hip flexion 4+ 4+  Hip extension 4- 4-  Hip abduction 4 4  Hip adduction    Hip internal rotation    Hip external rotation    Knee flexion 5 5  Knee extension 5 5  Ankle dorsiflexion 5 5  Ankle plantarflexion    Ankle inversion    Ankle eversion     (Blank rows = not tested) * = pain/symptoms  UPPER EXTREMITY ROM: WFL for tasks assessed    UPPER EXTREMITY MMT:  MMT Right 03/13/2024 Left 03/13/2024  Shoulder flexion 4 4  Shoulder extension    Shoulder abduction 4 4*  Shoulder adduction    Shoulder internal rotation 5 5  Shoulder external rotation 4+ 4+  Middle trapezius    Lower trapezius    Elbow flexion    Elbow extension    Wrist flexion    Wrist extension    Wrist ulnar deviation    Wrist radial deviation    Wrist pronation    Wrist supination    Grip strength (lbs)    (Blank rows = not tested) *= pain/symptoms    FUNCTIONAL TESTS:  5 times sit to stand: 10.22 seconds without UE support  GAIT: Distance walked: 100 Assistive device utilized: None Level of assistance:  Complete Independence Comments: WFL  TODAY'S TREATMENT:                                                                                                                              DATE:  03/13/24  Eval, HEP, education   PATIENT EDUCATION:  Education details: Patient educated on exam findings, POC, scope of PT, HEP, relevant anatomy and body mechanics, and posture. Person educated: Patient Education method: Explanation, Demonstration, and Handouts Education comprehension: verbalized understanding, returned demonstration, verbal cues required, and tactile cues required  HOME EXERCISE PROGRAM: Access Code: [email or web address] URL: [email or web address] Date: 03/13/2024 Prepared by: Debria Fang Zyrion Coey  Exercises - Supine Bridge  - 1 x daily - 7 x weekly - 2 sets - 10 reps - Child's Pose Stretch  - 1 x daily - 7 x weekly - 10 reps - 10 second hold  ASSESSMENT:  CLINICAL IMPRESSION: Patient a 43 y.o. y.o. female who was seen today for physical therapy evaluation and treatment for pain in thoracic spine. Patient presents with pain limited deficits in thoracic spine strength, spinal mobility, hyperactive and tender musculature, endurance, activity tolerance, posture, and functional mobility with ADL. Patient is having to modify and restrict ADL as indicated by outcome measure score as well as subjective information and objective measures which is affecting overall participation. Patient will benefit from skilled physical therapy in order to improve function and reduce impairment.  OBJECTIVE IMPAIRMENTS: Abnormal gait, decreased activity tolerance, decreased endurance, decreased mobility, decreased ROM, decreased strength, hypomobility, increased muscle spasms, impaired flexibility, improper body mechanics, postural dysfunction, and pain.   ACTIVITY LIMITATIONS: carrying, lifting, bending, sitting, standing, squatting, stairs, transfers, locomotion level, and caring for  others  PARTICIPATION LIMITATIONS: meal prep, cleaning, laundry, shopping, community activity, and yard work  PERSONAL FACTORS: Fitness and 1-2 comorbidities: CKD, HTN/HLD are also affecting patient's functional outcome.   REHAB POTENTIAL: Good  CLINICAL DECISION MAKING: Stable/uncomplicated  EVALUATION COMPLEXITY: Low   GOALS: Goals reviewed with patient? Yes  SHORT TERM GOALS: Target date: 04/10/2024    Patient will be independent with HEP in order to improve functional outcomes. Baseline:  Goal status: INITIAL  2.  Patient will report at least 25% improvement in symptoms for improved quality of life. Baseline:  Goal status: INITIAL    LONG TERM GOALS: Target date: 05/08/2024    Patient will report at least 75% improvement in symptoms for improved quality of life. Baseline:  Goal status: INITIAL  2.  Patient will improve Modified oswestry score by at least 5 points in order to indicate improved tolerance to activity. Baseline:  Goal status: INITIAL  3.  Patient will demonstrate grade of 5/5 MMT grade in all tested musculature as evidence of improved strength to assist with stair ambulation and gait as well as lifting overhead. Baseline:  Goal status: INITIAL  4.  Patient will demonstrate improved posture without cueing for reduced irritation of spinal musculature.  Baseline:  Goal status: INITIAL  5.  Patient will demonstrate grade of 5/5 MMT grade in all tested musculature as evidence of improved strength to assist with stair ambulation and gait.   Baseline:  Goal status: INITIAL    PLAN:  PT FREQUENCY: 1-2x/week  PT DURATION: 8 weeks  PLANNED INTERVENTIONS: [id number]- PT Re-evaluation, 97110-Therapeutic exercises, 97530- Therapeutic activity, 97112- Neuromuscular re-education, 97535- Self Care, 14782- Manual therapy, [phone]- Gait training, [phone]- Orthotic Fit/training, [phone]- Canalith repositioning, [id number]- Aquatic Therapy, [phone]- Splinting, Patient/Family  education, Balance training, Stair training, Taping, Dry Needling, Joint mobilization, Joint manipulation, Spinal manipulation, Spinal mobilization, Scar mobilization, and DME instructions.  PLAN FOR NEXT SESSION: f/u with HEP, thoracic mobility; core, hip, postural, shoulder strength   Perfecto Bracket Saadiq Poche, PT 03/13/2024, 2:31 PM

## 2024-03-20 ENCOUNTER — Encounter (HOSPITAL_BASED_OUTPATIENT_CLINIC_OR_DEPARTMENT_OTHER): Admitting: Physical Therapy

## 2024-04-04 ENCOUNTER — Ambulatory Visit (HOSPITAL_BASED_OUTPATIENT_CLINIC_OR_DEPARTMENT_OTHER): Attending: Nurse Practitioner | Admitting: Physical Therapy

## 2024-04-04 DIAGNOSIS — M546 Pain in thoracic spine: Secondary | ICD-10-CM | POA: Insufficient documentation

## 2024-04-04 DIAGNOSIS — M6281 Muscle weakness (generalized): Secondary | ICD-10-CM | POA: Insufficient documentation

## 2024-04-04 DIAGNOSIS — R2689 Other abnormalities of gait and mobility: Secondary | ICD-10-CM | POA: Insufficient documentation

## 2024-04-04 DIAGNOSIS — R29898 Other symptoms and signs involving the musculoskeletal system: Secondary | ICD-10-CM | POA: Insufficient documentation

## 2024-04-09 ENCOUNTER — Telehealth (HOSPITAL_BASED_OUTPATIENT_CLINIC_OR_DEPARTMENT_OTHER): Payer: Self-pay | Admitting: Physical Therapy

## 2024-04-09 ENCOUNTER — Ambulatory Visit (HOSPITAL_BASED_OUTPATIENT_CLINIC_OR_DEPARTMENT_OTHER): Admitting: Physical Therapy

## 2024-04-09 NOTE — Telephone Encounter (Signed)
 Patient no show, left message for patient to make her aware of missed appointment and reminded of next appointment. Educated on attendance policy and cancelled remaining appointments besides the next one.   11:22 AM, 04/09/24 Beather Liming PT, DPT Physical Therapist at [facility]

## 2024-04-13 ENCOUNTER — Encounter (HOSPITAL_BASED_OUTPATIENT_CLINIC_OR_DEPARTMENT_OTHER): Admitting: Rehabilitative and Restorative Service Providers"

## 2024-04-16 ENCOUNTER — Encounter (HOSPITAL_BASED_OUTPATIENT_CLINIC_OR_DEPARTMENT_OTHER): Admitting: Physical Therapy

## 2024-04-18 ENCOUNTER — Encounter (HOSPITAL_BASED_OUTPATIENT_CLINIC_OR_DEPARTMENT_OTHER): Admitting: Physical Therapy

## 2024-04-23 ENCOUNTER — Encounter (HOSPITAL_BASED_OUTPATIENT_CLINIC_OR_DEPARTMENT_OTHER): Admitting: Physical Therapy

## 2024-04-26 ENCOUNTER — Encounter (HOSPITAL_BASED_OUTPATIENT_CLINIC_OR_DEPARTMENT_OTHER): Admitting: Physical Therapy

## 2024-04-29 ENCOUNTER — Encounter (HOSPITAL_BASED_OUTPATIENT_CLINIC_OR_DEPARTMENT_OTHER)

## 2024-05-02 ENCOUNTER — Encounter (HOSPITAL_BASED_OUTPATIENT_CLINIC_OR_DEPARTMENT_OTHER): Admitting: Physical Therapy

## 2024-05-07 ENCOUNTER — Encounter (HOSPITAL_BASED_OUTPATIENT_CLINIC_OR_DEPARTMENT_OTHER): Admitting: Physical Therapy

## 2024-05-07 ENCOUNTER — Ambulatory Visit: Payer: Self-pay | Admitting: Nurse Practitioner

## 2024-05-07 LAB — HM MAMMOGRAPHY

## 2024-05-11 ENCOUNTER — Encounter (HOSPITAL_BASED_OUTPATIENT_CLINIC_OR_DEPARTMENT_OTHER): Admitting: Physical Therapy

## 2024-06-05 ENCOUNTER — Encounter (HOSPITAL_COMMUNITY): Payer: Self-pay

## 2024-06-05 ENCOUNTER — Emergency Department (HOSPITAL_COMMUNITY)

## 2024-06-05 ENCOUNTER — Emergency Department (HOSPITAL_COMMUNITY)
Admission: EM | Admit: 2024-06-05 | Discharge: 2024-06-05 | Disposition: A | Discharge status: Home / Self Care | Attending: Emergency Medicine | Admitting: Emergency Medicine

## 2024-06-05 DIAGNOSIS — N946 Dysmenorrhea, unspecified: Secondary | ICD-10-CM | POA: Diagnosis not present

## 2024-06-05 DIAGNOSIS — R911 Solitary pulmonary nodule: Secondary | ICD-10-CM | POA: Diagnosis not present

## 2024-06-05 DIAGNOSIS — D649 Anemia, unspecified: Secondary | ICD-10-CM | POA: Insufficient documentation

## 2024-06-05 DIAGNOSIS — I129 Hypertensive chronic kidney disease with stage 1 through stage 4 chronic kidney disease, or unspecified chronic kidney disease: Secondary | ICD-10-CM | POA: Insufficient documentation

## 2024-06-05 DIAGNOSIS — N184 Chronic kidney disease, stage 4 (severe): Secondary | ICD-10-CM | POA: Diagnosis not present

## 2024-06-05 DIAGNOSIS — R918 Other nonspecific abnormal finding of lung field: Secondary | ICD-10-CM

## 2024-06-05 DIAGNOSIS — R1011 Right upper quadrant pain: Secondary | ICD-10-CM | POA: Diagnosis present

## 2024-06-05 LAB — COMPREHENSIVE METABOLIC PANEL WITH GFR
ALT: 7 U/L (ref 0–44)
AST: 12 U/L — ABNORMAL LOW (ref 15–41)
Albumin: 3.2 g/dL — ABNORMAL LOW (ref 3.5–5.0)
Alkaline Phosphatase: 62 U/L (ref 38–126)
Anion gap: 7 (ref 5–15)
BUN: 38 mg/dL — ABNORMAL HIGH (ref 6–20)
CO2: 17 mmol/L — ABNORMAL LOW (ref 22–32)
Calcium: 8.6 mg/dL — ABNORMAL LOW (ref 8.9–10.3)
Chloride: 114 mmol/L — ABNORMAL HIGH (ref 98–111)
Creatinine, Ser: 2.19 mg/dL — ABNORMAL HIGH (ref 0.44–1.00)
GFR, Estimated: 28 mL/min — ABNORMAL LOW (ref >60)
Glucose, Bld: 110 mg/dL — ABNORMAL HIGH (ref 70–99)
Potassium: 4.5 mmol/L (ref 3.5–5.1)
Sodium: 138 mmol/L (ref 135–145)
Total Bilirubin: 0.5 mg/dL (ref 0.0–1.2)
Total Protein: 6.9 g/dL (ref 6.5–8.1)

## 2024-06-05 LAB — CBC WITH DIFFERENTIAL/PLATELET
Abs Immature Granulocytes: 0.03 K/uL (ref 0.00–0.07)
Basophils Absolute: 0 K/uL (ref 0.0–0.1)
Basophils Relative: 1 %
Eosinophils Absolute: 0.2 K/uL (ref 0.0–0.5)
Eosinophils Relative: 3 %
HCT: 32.7 % — ABNORMAL LOW (ref 36.0–46.0)
Hemoglobin: 10.8 g/dL — ABNORMAL LOW (ref 12.0–15.0)
Immature Granulocytes: 1 %
Lymphocytes Relative: 21 %
Lymphs Abs: 1.1 K/uL (ref 0.7–4.0)
MCH: 29 pg (ref 26.0–34.0)
MCHC: 33 g/dL (ref 30.0–36.0)
MCV: 87.9 fL (ref 80.0–100.0)
Monocytes Absolute: 0.5 K/uL (ref 0.1–1.0)
Monocytes Relative: 8 %
Neutro Abs: 3.6 K/uL (ref 1.7–7.7)
Neutrophils Relative %: 66 %
Platelets: 163 K/uL (ref 150–400)
RBC: 3.72 MIL/uL — ABNORMAL LOW (ref 3.87–5.11)
RDW: 13.4 % (ref 11.5–15.5)
WBC: 5.5 K/uL (ref 4.0–10.5)
nRBC: 0 % (ref 0.0–0.2)

## 2024-06-05 LAB — URINALYSIS, ROUTINE W REFLEX MICROSCOPIC
Bacteria, UA: NONE SEEN
Bilirubin Urine: NEGATIVE
Glucose, UA: NEGATIVE mg/dL
Ketones, ur: NEGATIVE mg/dL
Leukocytes,Ua: NEGATIVE
Nitrite: NEGATIVE
Protein, ur: 100 mg/dL — AB
Specific Gravity, Urine: 1.006 (ref 1.005–1.030)
pH: 5 (ref 5.0–8.0)

## 2024-06-05 LAB — LIPASE, BLOOD: Lipase: 42 U/L (ref 11–51)

## 2024-06-05 LAB — HCG, SERUM, QUALITATIVE: Preg, Serum: NEGATIVE

## 2024-06-05 MED ORDER — IBUPROFEN 600 MG PO TABS
600.0000 mg | ORAL_TABLET | Freq: Four times a day (QID) | ORAL | 0 refills | Status: DC | PRN
  Filled 2024-06-05: qty 30, 8d supply, fill #0

## 2024-06-05 MED ORDER — SODIUM CHLORIDE 0.9 % IV BOLUS
1000.0000 mL | Freq: Once | INTRAVENOUS | Status: AC
  Administered 2024-06-05: 1000 mL via INTRAVENOUS

## 2024-06-05 NOTE — ED Triage Notes (Signed)
 Pt BIB ems from home for abdominal pain starting today in the upper and lower right quadrants, along with SOB. Pt also states she has had increased bleeding with her periods lately, period was 2 weeks ago and it is back this week which is not normal for her cycle.  A&Ox4, VS stable

## 2024-06-05 NOTE — ED Provider Notes (Signed)
 [place] [facility] DEPARTMENT AT [facility] Provider Note   CSN: [id number] Arrival date & time: 06/05/24  1501     Patient presents with: Abdominal Pain   Samantha Barr is a 43 y.o. female.   Pt is a 43 yo female with pmhx significant for htn and ckd.  Pt has severe RUQ abd pain which started today.  Pt also said her period started today and it's very heavy.  She has had irregular periods for the past few months.  She had a light period about 2 weeks ago.       Prior to Admission medications   Medication Sig Start Date End Date Taking? Authorizing Provider  ibuprofen  (ADVIL ) 600 MG tablet Take 1 tablet (600 mg total) by mouth every 6 (six) hours as needed. 06/05/24  Yes Dean Clarity, MD  omeprazole  (PRILOSEC) 20 MG capsule Take 1 capsule (20 mg total) by mouth daily. 02/14/24   Fleming, Zelda W, NP  rosuvastatin  (CRESTOR ) 20 MG tablet Take 1 tablet (20 mg total) by mouth daily. 02/16/24 05/20/24  Fleming, Zelda W, NP  Sparsentan  (FILSPARI ) 400 MG TABS Take 400 mg (1 tablet) by mouth daily. 02/22/23   Tobie Gordy POUR, MD    Allergies: Gabapentin     Review of Systems  Gastrointestinal:  Positive for abdominal pain.  All other systems reviewed and are negative.   Updated Vital Signs BP 126/82   Pulse 84   Temp 98.2 F (36.8 C) (Oral)   Resp 16   Ht 5' 5 (1.651 m)   Wt 73 kg   SpO2 100%   BMI 26.78 kg/m   Physical Exam Vitals and nursing note reviewed.  Constitutional:      Appearance: She is well-developed.  HENT:     Head: Normocephalic and atraumatic.     Mouth/Throat:     Mouth: Mucous membranes are moist.     Pharynx: Oropharynx is clear.  Eyes:     Extraocular Movements: Extraocular movements intact.     Pupils: Pupils are equal, round, and reactive to light.  Cardiovascular:     Rate and Rhythm: Normal rate and regular rhythm.     Heart sounds: Normal heart sounds.  Pulmonary:     Effort: Pulmonary effort is normal.      Breath sounds: Normal breath sounds.  Abdominal:     General: Abdomen is flat.     Palpations: Abdomen is soft.     Tenderness: There is abdominal tenderness in the right upper quadrant and epigastric area.  Skin:    General: Skin is warm.     Capillary Refill: Capillary refill takes less than 2 seconds.  Neurological:     General: No focal deficit present.     Mental Status: She is alert and oriented to person, place, and time.  Psychiatric:        Mood and Affect: Mood normal.        Behavior: Behavior normal.     (all labs ordered are listed, but only abnormal results are displayed) Labs Reviewed  CBC WITH DIFFERENTIAL/PLATELET - Abnormal; Notable for the following components:      Result Value   RBC 3.72 (*)    Hemoglobin 10.8 (*)    HCT 32.7 (*)    All other components within normal limits  COMPREHENSIVE METABOLIC PANEL WITH GFR - Abnormal; Notable for the following components:   Chloride 114 (*)    CO2 17 (*)    Glucose, Bld 110 (*)  BUN 38 (*)    Creatinine, Ser 2.19 (*)    Calcium  8.6 (*)    Albumin 3.2 (*)    AST 12 (*)    GFR, Estimated 28 (*)    All other components within normal limits  URINALYSIS, ROUTINE W REFLEX MICROSCOPIC - Abnormal; Notable for the following components:   Color, Urine STRAW (*)    Hgb urine dipstick LARGE (*)    Protein, ur 100 (*)    All other components within normal limits  LIPASE, BLOOD  HCG, SERUM, QUALITATIVE    EKG: None  Radiology: CT Renal Stone Study Result Date: 06/05/2024 CLINICAL DATA:  Abdominal pain, stone suspected EXAM: CT ABDOMEN AND PELVIS WITHOUT CONTRAST TECHNIQUE: Multidetector CT imaging of the abdomen and pelvis was performed following the standard protocol without IV contrast. RADIATION DOSE REDUCTION: This exam was performed according to the departmental dose-optimization program which includes automated exposure control, adjustment of the mA and/or kV according to patient size and/or use of iterative  reconstruction technique. COMPARISON:  Ultrasound abdomen June 05, 2024, CT abdomen pelvis May 24, 2021 FINDINGS: Exam is suboptimal given the lack of contrast. Lower chest: There are 2 sub solid and a solid nodule in right lower lobe as follows Ground-glass component measuring 1 cm, solid component 3 mm (5/7). Ground-glass component measuring 9 mm solid component measuring 5 mm (5/5) a subpleural solid nodule measuring 5 mm (5/16). These nodules are new to prior exam from 2022. Some of the previously seen right lower lobe nodules on prior exam are resolved. Hepatobiliary: No focal liver abnormality is seen. Focal pneumobilia in the left intrahepatic duct, similar to prior exam and indeterminate. No gallstones, gallbladder wall thickening, or biliary dilatation. Pancreas: Unremarkable. No pancreatic ductal dilatation or surrounding inflammatory changes. Spleen: Normal in size without focal abnormality. Adrenals/Urinary Tract: Adrenal glands are unremarkable. Atrophic changes of the kidneys and few simple cortical cysts which does not require imaging follow-up, similar to prior. No definite nephrolithiasis or hydronephrosis. Distended bladder. Stomach/Bowel: Stomach is within normal limits. Appendix appears normal. No evidence of bowel wall thickening, distention, or inflammatory changes. Moderate amount of stool throughout the colon. Vascular/Lymphatic: No significant vascular findings are present. No enlarged abdominal or pelvic lymph nodes. Reproductive: Mildly enlarged heterogeneous uterus. Normal bilateral adnexa. Other: Cystic structure versus loculated fluid in umbilical region measuring 2.2 x 2 cm. No abdominopelvic ascites. Musculoskeletal: No acute or significant osseous findings. IMPRESSION: No nephrolithiasis or hydronephrosis.  Distended bladder. Multiple sub solid pulmonary nodules as detailed above, new to prior CT. Some of the previously seen ground-glass nodules are resolved. Findings may represent  waxing and waning infection/inflammation (including COVID-19). Adenocarcinoma spectrum (lepidic variant) cannot be completely excluded . Follow-up according to guidelines below. Loculated fluid versus cystic structure in umbilical region (cyst, abscess, endometriosis?). Correlate with clinical findings. Ultrasound can be utilized for further characterization. Part solid: <73mm:  No routine follow-up >=60mm: CT at 3-6 months to confirm persistence. If unchanged and solid component remains less than 6 mm, annual CT should be performed for 5 years. In practice, part-solid nodules cannot be defined as such until >=6 mm, and nodules ,<6 mm do not usually require follow-up. Persistent part-solid nodules with solid components >=6 mm should be considered highly suspicious (recommendations 4A-4C) Multiple : <94mm: CT at 3-6 months. If stable,consider CT at 2 and 4 years. >= 6mm: CT at 3-6 months. Subsequent management based on the most suspicious nodule(s). Multiple <6 mm pure ground-glass nodules are usually benign, but consider follow-up  in selected patients at high risk at 204 years (recommendation 5A). Note.-These recommendations do not apply to lung cancer screening, patients with immunosuppression, or patients with known primary cancer. https://urldefense.com/v3/__https://pubs.InstantPositions.nl.7982838340__[id number]$ Electronically Signed   By: Duwaine Severs M.D.   On: 06/05/2024 20:03   US  Abdomen Limited RUQ (LIVER/GB) Result Date: 06/05/2024 CLINICAL DATA:  855384 Pain 144615. EXAM: ULTRASOUND ABDOMEN LIMITED RIGHT UPPER QUADRANT COMPARISON:  None Available. FINDINGS: Gallbladder: No gallstones or wall thickening visualized. No sonographic Murphy sign noted by sonographer. Common bile duct: Diameter: Up to 2 mm.  No intrahepatic bile duct dilation. Liver: No focal lesion identified. Within normal limits in  parenchymal echogenicity. Portal vein is patent on color Doppler imaging with normal direction of blood flow towards the liver. Other: Incidentally noted increased right renal cortical echogenicity, which is nonspecific but commonly seen with medical renal disease. IMPRESSION: 1. No sonographic evidence of acute cholecystitis. 2. Increased right renal cortical echogenicity, which is nonspecific but commonly seen with medical renal disease. Electronically Signed   By: Ree Molt M.D.   On: 06/05/2024 16:14     Procedures   Medications Ordered in the ED  sodium chloride  0.9 % bolus 1,000 mL (0 mLs Intravenous Stopped 06/05/24 1738)                                    Medical Decision Making Amount and/or Complexity of Data Reviewed Labs: ordered. Radiology: ordered.  Risk Prescription drug management.   This patient presents to the ED for concern of abd pain, this involves an extensive number of treatment options, and is a complaint that carries with it a high risk of complications and morbidity.  The differential diagnosis includes cholelithiasis, pancreatitis, pregnancy, ovarian cyst   Co morbidities that complicate the patient evaluation  HTN and CKD   Additional history obtained:  Additional history obtained from epic chart review External records from outside source obtained and reviewed including family   Lab Tests:  I Ordered, and personally interpreted labs.  The pertinent results include:  cbc with hgb 10.8 (stable); cmp with cr 2.19 (stable); lipase nl; ua + hgb (period); protein +   Imaging Studies ordered:  I ordered imaging studies including RUQ US  and Renal CT  I independently visualized and interpreted imaging which showed  RUQ US :  No sonographic evidence of acute cholecystitis.  2. Increased right renal cortical echogenicity, which is nonspecific  but commonly seen with medical renal disease.  Renal CT: No nephrolithiasis or hydronephrosis.  Distended  bladder.    Multiple sub solid pulmonary nodules as detailed above, new to prior  CT. Some of the previously seen ground-glass nodules are resolved.  Findings may represent waxing and waning infection/inflammation  (including COVID-19). Adenocarcinoma spectrum (lepidic variant)  cannot be completely excluded . Follow-up according to guidelines  below.    Loculated fluid versus cystic structure in umbilical region (cyst,  abscess, endometriosis?). Correlate with clinical findings.  Ultrasound can be utilized for further characterization.    Part solid:    <8mm:  No routine follow-up    >=35mm: CT at 3-6 months to confirm persistence. If unchanged and  solid component remains less than 6 mm, annual CT should be  performed for 5 years.    In practice, part-solid nodules cannot be defined as such until >=6  mm, and nodules ,<6 mm do not usually require follow-up. Persistent  part-solid nodules with solid components >=  6 mm should be considered  highly suspicious (recommendations 4A-4C)    Multiple :    <65mm: CT at 3-6 months. If stable,consider CT at 2 and 4 years.    >= 6mm: CT at 3-6 months. Subsequent management based on the most  suspicious nodule(s).    Multiple <6 mm pure ground-glass nodules are usually benign, but  consider follow-up in selected patients at high risk at 204 years  (recommendation 5A).    Note.-These recommendations do not apply to lung cancer screening,  patients with immunosuppression, or patients with known primary  cancer.   I agree with the radiologist interpretation  Medicines ordered and prescription drug management:  I ordered medication including ivfs for sx  Reevaluation of the patient after these medicines showed that the patient improved I have reviewed the patients home medicines and have made adjustments as needed   Test Considered:  Us , ct   Problem List / ED Course:  Abd pain:  likely due to dysmenorrhea.  Possible  endometriosis.  Pt is to f/u with obgyn.  Pain is gone now. CKD: chronic Pulm nodules:  pt will need repeat CT in 3-6 months   Reevaluation:  After the interventions noted above, I reevaluated the patient and found that they have :improved   Social Determinants of Health:  Lives at home   Dispostion:  After consideration of the diagnostic results and the patients response to treatment, I feel that the patent would benefit from discharge with outpatient f/u.       Final diagnoses:  Dysmenorrhea  CKD (chronic kidney disease) stage 4, GFR 15-29 ml/min (HCC)  Pulmonary nodules    ED Discharge Orders          Ordered    ibuprofen  (ADVIL ) 600 MG tablet  Every 6 hours PRN        06/05/24 2043               Mahari Strahm, MD 06/05/24 2045

## 2024-06-05 NOTE — Discharge Instructions (Addendum)
 You have several lung nodules.  You will need a repeat CT scan in 3-6 months to make sure they are not enlarging.  Your PCP can order this.

## 2024-06-06 ENCOUNTER — Other Ambulatory Visit: Payer: Self-pay

## 2024-06-10 ENCOUNTER — Other Ambulatory Visit: Payer: Self-pay

## 2024-08-16 ENCOUNTER — Encounter: Payer: Self-pay | Admitting: Nurse Practitioner

## 2024-08-16 ENCOUNTER — Other Ambulatory Visit: Payer: Self-pay

## 2024-08-16 ENCOUNTER — Ambulatory Visit: Attending: Nurse Practitioner | Admitting: Nurse Practitioner

## 2024-08-16 VITALS — BP 102/69 | HR 96 | Resp 19 | Ht 65.0 in | Wt 159.6 lb

## 2024-08-16 DIAGNOSIS — I10 Essential (primary) hypertension: Secondary | ICD-10-CM

## 2024-08-16 DIAGNOSIS — Z1211 Encounter for screening for malignant neoplasm of colon: Secondary | ICD-10-CM | POA: Diagnosis not present

## 2024-08-16 DIAGNOSIS — N1832 Chronic kidney disease, stage 3b: Secondary | ICD-10-CM

## 2024-08-16 DIAGNOSIS — E7800 Pure hypercholesterolemia, unspecified: Secondary | ICD-10-CM

## 2024-08-16 DIAGNOSIS — M546 Pain in thoracic spine: Secondary | ICD-10-CM

## 2024-08-16 DIAGNOSIS — G8929 Other chronic pain: Secondary | ICD-10-CM

## 2024-08-16 DIAGNOSIS — E559 Vitamin D deficiency, unspecified: Secondary | ICD-10-CM

## 2024-08-16 MED ORDER — METHOCARBAMOL 500 MG PO TABS
500.0000 mg | ORAL_TABLET | Freq: Three times a day (TID) | ORAL | 1 refills | Status: DC | PRN
  Filled 2024-08-16: qty 60, 20d supply, fill #0

## 2024-08-16 NOTE — Progress Notes (Signed)
 Assessment & Plan:  [name] was seen today for hypertension.  Diagnoses and all orders for this visit:  Chronic bilateral thoracic back pain -     methocarbamol (ROBAXIN) 500 MG tablet; Take 1 tablet (500 mg total) by mouth every 8 (eight) hours as needed for muscle spasms. Chronic back pain, likely musculoskeletal, possibly related to posture and sleeping position. Ibuprofen  contraindicated due to chronic kidney disease. - Prescribe topical gel for pain relief. - Prescribe methocarbamol at night as needed. - Consider tramadol if gel and muscle relaxant are ineffective. - Advise on posture and sleeping position adjustments.  Hypercholesterolemia -     Lipid panel  Vitamin D  deficiency disease -     VITAMIN D  25 Hydroxy (Vit-D Deficiency, Fractures)  Stage 3b chronic kidney disease (HCC) -     CBC with Differential   Colon cancer screening -     Ambulatory referral to Gastroenterology  Patient has been counseled on age-appropriate routine health concerns for screening and prevention. These are reviewed and up-to-date. Referrals have been placed accordingly. Immunizations are up-to-date or declined.    Subjective:   Chief Complaint  Patient presents with   Hypertension   History of Present Illness Samantha Barr is a 43 year old female with chronic kidney disease who presents with back pain.  She experiences upper back pain, which she sometimes associates with her menstrual cycle. The pain is most pronounced upon waking and tends to improve throughout the day. She denies regular heavy lifting but mentions occasional lifting at work, such as taking out trash bags. She has not been able to continue physical therapy due to cost constraints. Her chronic kidney disease prevents her from using ibuprofen  for pain management. She is currently not taking any medication for her back pain due to her kidney condition. She has previously been prescribed ibuprofen  but did not  take it. She is considering alternative pain management options.   She recalls a previous episode of severe stomach pain during a heavy menstrual period, which required hospital evaluation. She is scheduled for a gynecology appointment in November to address menstrual irregularities.  She experiences shoulder pain at night, particularly on the side she sleeps on. She is unsure if this is related to her vitamin D  levels.  She is planning to get married and is considering birth control options due to her kidney disease. She has four children and her fianc has four children as well.   Review of Systems  Constitutional: Negative.   Respiratory: Negative.    Cardiovascular: Negative.   Gastrointestinal: Negative.   Genitourinary:        Menorrhagia  Musculoskeletal:  Positive for back pain.  Neurological: Negative.     Past Medical History:  Diagnosis Date   Hypertension    Renal disorder        Family History  Problem Relation Age of Onset   Diabetes Mother    Breast cancer Father    Hypertension Maternal Aunt     Social History Reviewed with no changes to be made today.   Outpatient Medications Prior to Visit  Medication Sig Dispense Refill   rosuvastatin  (CRESTOR ) 20 MG tablet Take 1 tablet (20 mg total) by mouth daily. 90 tablet 3   Sparsentan  (FILSPARI ) 400 MG TABS Take 400 mg (1 tablet) by mouth daily. 90 tablet 3   omeprazole  (PRILOSEC) 20 MG capsule Take 1 capsule (20 mg total) by mouth daily. (Patient not taking: Reported on 08/16/2024) 30 capsule 3  ibuprofen  (ADVIL ) 600 MG tablet Take 1 tablet (600 mg total) by mouth every 6 (six) hours as needed. (Patient not taking: Reported on 08/16/2024) 30 tablet 0   No facility-administered medications prior to visit.    Allergies  Allergen Reactions   Gabapentin      DIZZINESS       Objective:    BP 102/69 (BP Location: Left Arm, Patient Position: Sitting, Cuff Size: Normal)   Pulse 96   Resp 19   Ht 5' 5  (1.651 m)   Wt 159 lb 9.6 oz (72.4 kg)   LMP 08/12/2024 (Exact Date)   SpO2 100%   BMI 26.56 kg/m  Wt Readings from Last 3 Encounters:  08/16/24 159 lb 9.6 oz (72.4 kg)  06/05/24 160 lb 15 oz (73 kg)  02/14/24 161 lb (73 kg)    Physical Exam Vitals and nursing note reviewed.  Constitutional:      Appearance: She is well-developed.  HENT:     Head: Normocephalic and atraumatic.  Cardiovascular:     Rate and Rhythm: Normal rate and regular rhythm.     Heart sounds: Normal heart sounds. No murmur heard.    No friction rub. No gallop.  Pulmonary:     Effort: Pulmonary effort is normal. No tachypnea or respiratory distress.     Breath sounds: Normal breath sounds. No decreased breath sounds, wheezing, rhonchi or rales.  Chest:     Chest wall: No tenderness.  Musculoskeletal:        General: Normal range of motion.     Cervical back: Normal range of motion.  Skin:    General: Skin is warm and dry.  Neurological:     Mental Status: She is alert and oriented to person, place, and time.     Coordination: Coordination normal.  Psychiatric:        Behavior: Behavior normal. Behavior is cooperative.        Thought Content: Thought content normal.        Judgment: Judgment normal.          Patient has been counseled extensively about nutrition and exercise as well as the importance of adherence with medications and regular follow-up. The patient was given clear instructions to go to ER or return to medical center if symptoms don't improve, worsen or new problems develop. The patient verbalized understanding.   Follow-up: Return in about 4 months (around 12/17/2024).   Haze LELON Servant, FNP-BC [facility] [place], [place] [phone]   09/01/2024, 12:25 PM

## 2024-08-16 NOTE — Patient Instructions (Signed)
 VOLTAREN GEL

## 2024-08-17 LAB — CMP14+EGFR
ALT: 6 IU/L (ref 0–32)
AST: 15 IU/L (ref 0–40)
Albumin: 4.1 g/dL (ref 3.9–4.9)
Alkaline Phosphatase: 75 IU/L (ref 41–116)
BUN/Creatinine Ratio: 14 (ref 9–23)
BUN: 34 mg/dL — ABNORMAL HIGH (ref 6–24)
Bilirubin Total: 0.3 mg/dL (ref 0.0–1.2)
CO2: 18 mmol/L — ABNORMAL LOW (ref 20–29)
Calcium: 9.2 mg/dL (ref 8.7–10.2)
Chloride: 110 mmol/L — ABNORMAL HIGH (ref 96–106)
Creatinine, Ser: 2.45 mg/dL — ABNORMAL HIGH (ref 0.57–1.00)
Globulin, Total: 2.8 g/dL (ref 1.5–4.5)
Glucose: 102 mg/dL — ABNORMAL HIGH (ref 70–99)
Potassium: 5.1 mmol/L (ref 3.5–5.2)
Sodium: 141 mmol/L (ref 134–144)
Total Protein: 6.9 g/dL (ref 6.0–8.5)
eGFR: 24 mL/min/1.73 — ABNORMAL LOW (ref >59)

## 2024-08-17 LAB — CBC WITH DIFFERENTIAL/PLATELET
Basophils Absolute: 0 x10E3/uL (ref 0.0–0.2)
Basos: 1 %
EOS (ABSOLUTE): 0.3 x10E3/uL (ref 0.0–0.4)
Eos: 7 %
Hematocrit: 32.3 % — ABNORMAL LOW (ref 34.0–46.6)
Hemoglobin: 9.9 g/dL — ABNORMAL LOW (ref 11.1–15.9)
Immature Grans (Abs): 0 x10E3/uL (ref 0.0–0.1)
Immature Granulocytes: 0 %
Lymphocytes Absolute: 1.4 x10E3/uL (ref 0.7–3.1)
Lymphs: 31 %
MCH: 26.5 pg — ABNORMAL LOW (ref 26.6–33.0)
MCHC: 30.7 g/dL — ABNORMAL LOW (ref 31.5–35.7)
MCV: 86 fL (ref 79–97)
Monocytes Absolute: 0.4 x10E3/uL (ref 0.1–0.9)
Monocytes: 8 %
Neutrophils Absolute: 2.4 x10E3/uL (ref 1.4–7.0)
Neutrophils: 53 %
Platelets: 185 x10E3/uL (ref 150–450)
RBC: 3.74 x10E6/uL — ABNORMAL LOW (ref 3.77–5.28)
RDW: 14.5 % (ref 11.7–15.4)
WBC: 4.5 x10E3/uL (ref 3.4–10.8)

## 2024-08-17 LAB — LIPID PANEL
Chol/HDL Ratio: 7.7 ratio — ABNORMAL HIGH (ref 0.0–4.4)
Cholesterol, Total: 246 mg/dL — ABNORMAL HIGH (ref 100–199)
HDL: 32 mg/dL — ABNORMAL LOW (ref >39)
LDL Chol Calc (NIH): 154 mg/dL — ABNORMAL HIGH (ref 0–99)
Triglycerides: 323 mg/dL — ABNORMAL HIGH (ref 0–149)
VLDL Cholesterol Cal: 60 mg/dL — ABNORMAL HIGH (ref 5–40)

## 2024-08-17 LAB — VITAMIN D 25 HYDROXY (VIT D DEFICIENCY, FRACTURES): Vit D, 25-Hydroxy: 19.9 ng/mL — ABNORMAL LOW (ref 30.0–100.0)

## 2024-08-18 ENCOUNTER — Ambulatory Visit: Payer: Self-pay | Admitting: Nurse Practitioner

## 2024-08-18 DIAGNOSIS — E559 Vitamin D deficiency, unspecified: Secondary | ICD-10-CM

## 2024-08-18 MED ORDER — VITAMIN D (ERGOCALCIFEROL) 1.25 MG (50000 UNIT) PO CAPS
50000.0000 [IU] | ORAL_CAPSULE | ORAL | 1 refills | Status: AC
  Filled 2024-08-18: qty 12, 84d supply, fill #0

## 2024-08-19 ENCOUNTER — Other Ambulatory Visit: Payer: Self-pay

## 2024-08-22 ENCOUNTER — Other Ambulatory Visit: Payer: Self-pay | Admitting: Nurse Practitioner

## 2024-08-22 ENCOUNTER — Other Ambulatory Visit: Payer: Self-pay

## 2024-08-26 ENCOUNTER — Other Ambulatory Visit: Payer: Self-pay

## 2024-09-01 ENCOUNTER — Encounter: Payer: Self-pay | Admitting: Nurse Practitioner

## 2024-09-18 ENCOUNTER — Other Ambulatory Visit: Payer: Self-pay

## 2024-09-18 ENCOUNTER — Encounter: Payer: Self-pay | Admitting: Physician Assistant

## 2024-09-18 ENCOUNTER — Ambulatory Visit (INDEPENDENT_AMBULATORY_CARE_PROVIDER_SITE_OTHER): Admitting: Physician Assistant

## 2024-09-18 ENCOUNTER — Other Ambulatory Visit (HOSPITAL_COMMUNITY)
Admission: RE | Admit: 2024-09-18 | Discharge: 2024-09-18 | Disposition: A | Source: Ambulatory Visit | Attending: Physician Assistant | Admitting: Physician Assistant

## 2024-09-18 VITALS — BP 110/71 | HR 96 | Ht 60.5 in | Wt 159.9 lb

## 2024-09-18 DIAGNOSIS — Z3009 Encounter for other general counseling and advice on contraception: Secondary | ICD-10-CM | POA: Diagnosis not present

## 2024-09-18 DIAGNOSIS — R109 Unspecified abdominal pain: Secondary | ICD-10-CM | POA: Diagnosis not present

## 2024-09-18 DIAGNOSIS — N946 Dysmenorrhea, unspecified: Secondary | ICD-10-CM | POA: Insufficient documentation

## 2024-09-18 DIAGNOSIS — N921 Excessive and frequent menstruation with irregular cycle: Secondary | ICD-10-CM | POA: Diagnosis not present

## 2024-09-18 MED ORDER — TRAMADOL HCL 50 MG PO TABS
50.0000 mg | ORAL_TABLET | Freq: Four times a day (QID) | ORAL | 0 refills | Status: AC | PRN
  Filled 2024-09-18: qty 12, 3d supply, fill #0

## 2024-09-18 MED ORDER — ACETAMINOPHEN 500 MG PO TABS: 1000.0000 mg | ORAL_TABLET | Freq: Three times a day (TID) | ORAL | 0 refills | Status: AC | PRN

## 2024-09-18 NOTE — Progress Notes (Signed)
 GYNECOLOGY  VISIT   HPI: Samantha Barr is a 43 y.o.  widowed female G56P4 with PMH hypertension, stage IIIb CKD, history of prolactinemia here for irregularly irregular periods that remitted for months at a time.  Menstrual flow is is mixed, sometimes consisting of spotting only or heavy days, where patient is going through 5-6 sanitary napkins daily. She reports her main priority today though is severe right sided abdominal pain that has occurred twice, during periods only. The pain was so severe last week that it caused her not to be able to move. She took Tylenol  for the pain with minimal relief and feels that it made her bleed more. When this pain comes it last 1 to 2 days. Patient was also seen for this pain at ED 06/05/24. Patient denies fever, headaches, pelvic pain, dyspareunia, dyschezia, dysuria, urinary frequency, constipation.  Patient is also inquiring today about birth control methods, as she plans to be married soon.   GYNECOLOGIC HISTORY: Patient's last menstrual period was 09/10/2024. Contraception: none   Menopausal hormone therapy: premenopausal Last mammogram: 05/07/2024, normal Last pap smear:  Diagnosis  Date Value Ref Range Status  05/07/2021   Final   - Negative for intraepithelial lesion or malignancy (NILM)           OB History     Gravida  5   Para  4   Term      Preterm      AB      Living  4      SAB      IAB      Ectopic      Multiple      Live Births                 Patient Active Problem List   Diagnosis Date Noted   Abnormal uterine bleeding (AUB) 06/09/2022   Essential hypertension 03/05/2018   Stage 3 chronic kidney disease (HCC) 03/05/2018    Past Medical History:  Diagnosis Date   Hypertension    Renal disorder     History reviewed. No pertinent surgical history.  Current Outpatient Medications  Medication Sig Dispense Refill   rosuvastatin  (CRESTOR ) 20 MG tablet Take 1 tablet (20 mg total) by mouth  daily. 90 tablet 3   Sparsentan  (FILSPARI ) 400 MG TABS Take 400 mg (1 tablet) by mouth daily. 90 tablet 3   Vitamin D , Ergocalciferol , (DRISDOL ) 1.25 MG (50000 UNIT) CAPS capsule Take 1 capsule (50,000 Units total) by mouth every 7 (seven) days. 12 capsule 1   No current facility-administered medications for this visit.     ALLERGIES: Gabapentin   Family History  Problem Relation Age of Onset   Diabetes Mother    Breast cancer Father    Hypertension Maternal Aunt     Social History   Socioeconomic History   Marital status: Widowed    Spouse name: Not on file   Number of children: Not on file   Years of education: Not on file   Highest education level: Not on file  Occupational History   Not on file  Tobacco Use   Smoking status: Never    Passive exposure: Never   Smokeless tobacco: Never  Vaping Use   Vaping status: Never Used  Substance and Sexual Activity   Alcohol use: No   Drug use: No   Sexual activity: Yes  Other Topics Concern   Not on file  Social History Narrative   Not on file  Social Drivers of Corporate Investment Banker Strain: Not on file  Food Insecurity: Not on file  Transportation Needs: Not on file  Physical Activity: Not on file  Stress: Not on file  Social Connections: Not on file  Intimate Partner Violence: Not on file    Review of Systems  CT renal stone study 06/05/24 MPRESSION: No nephrolithiasis or hydronephrosis.  Distended bladder.  -Multiple sub solid pulmonary nodules as detailed above, new to prior CT. Some of the previously seen ground-glass nodules are resolved. Findings may represent waxing and waning infection/inflammation (including COVID-19). Adenocarcinoma spectrum (lepidic variant) cannot be completely excluded . Follow-up according to guidelines below.  -Loculated fluid versus cystic structure in umbilical region (cyst, abscess, endometriosis?). Correlate with clinical findings. Ultrasound can be utilized for  further characterization.  -Part solid:  <59mm:  No routine follow-up  >=93mm: CT at 3-6 months to confirm persistence. If unchanged and solid component remains less than 6 mm, annual CT should be performed for 5 years.  -In practice, part-solid nodules cannot be defined as such until >=6 mm, and nodules ,<6 mm do not usually require follow-up. Persistent part-solid nodules with solid components >=6 mm should be considered highly suspicious (recommendations 4A-4C)  -Multiple :  <62mm: CT at 3-6 months. If stable,consider CT at 2 and 4 years.  >= 6mm: CT at 3-6 months. Subsequent management based on the most suspicious nodule(s).  Multiple <6 mm pure ground-glass nodules are usually benign, but consider follow-up in selected patients at high risk at 204 years (recommendation 5A).   PHYSICAL EXAMINATION:    BP 110/71 (BP Location: Right Arm, Patient Position: Sitting, Cuff Size: Normal)   Pulse 96   Ht 5' 0.5 (1.537 m)   Wt 159 lb 14.4 oz (72.5 kg)   LMP 09/10/2024   BMI 30.71 kg/m     General appearance: alert, cooperative and appears stated age Head: Normocephalic, without obvious abnormality, atraumatic Neck: no adenopathy, supple, symmetrical, trachea midline and thyroid  normal to inspection and palpation Lungs: clear to auscultation bilaterally Heart: regular rate and rhythm Abdomen: Soft, non-tender, no masses, no organomegaly, no flank pain Lymph nodes: Cervical, supraclavicular, and axillary nodes normal. No abnormal inguinal nodes palpated Neurologic: Grossly normal  Pelvic: External genitalia:  no lesions              Urethra:  normal appearing urethra with no masses, tenderness or lesions              Bartholins and Skenes: normal                 Vagina: normal appearing vagina with normal color and discharge, no lesions              Cervix: no lesions                Bimanual Exam:  Uterus:  normal size, contour, position, consistency, mobility, non-tender               Adnexa: no mass, fullness, tenderness  Chaperone was present for exam  ASSESSMENT & PLAN   1. Right lateral abdominal pain (Primary) 43 year old female patient with PMH HTN, CKD, reporting two episodes of severe, right-sided abdominal pain during menses without other associated symptoms. She was seen at the ED for the first episode 06/05/24. A CT renal stone showed mildly enlarged heterogeneous uterus and normal bilateral adnexa. Cystic structure verses loculated fluid in umbilical region measuring 2.2 x 2 cm, possible cyst, abscess,  or endometriosis.   Today, patient is well-appearing without pain. No abdominal TTP or flank pain. No abnormality on pelvic/bimanual exam.   Ddx includes ovarian cyst, cyst rupture, endometriosis, adenomyosis, leiomyoma. Considered PID.   Will get the following tests and formulate plan on return of results. Due to patient history of CKD and severity described, will send short course of tramadol and Tylenol  over NSAIDs to treat any future acute episodes of pain.   - Cervicovaginal ancillary only - US  PELVIC COMPLETE WITH TRANSVAGINAL; Future - traMADol (ULTRAM) 50 MG tablet; Take 1 tablet (50 mg total) by mouth every 6 (six) hours as needed for up to 3 days for severe pain (pain score 7-10).  Dispense: 12 tablet; Refill: 0 - acetaminophen  (TYLENOL ) 500 MG tablet; Take 2 tablets (1,000 mg total) by mouth every 8 (eight) hours as needed.  Dispense: 30 tablet; Refill: 0  2. Menorrhagia with irregular cycle - CBC - Iron, TIBC and Ferritin Panel - TSH Rfx on Abnormal to Free T4  - US  PELVIC COMPLETE WITH TRANSVAGINAL; Future  3. Encounter for counseling regarding contraception  Considering her history and dysmenorrhea, patient will likely need Etonogestrel implant or IUD for contraception. Will decide on return of results.  An After Visit Summary was printed and given to the patient.  Addis Bennie E Charnise Lovan, PA-C 11/19/20254:58 PM

## 2024-09-18 NOTE — Progress Notes (Signed)
 Can miss menses for up to 2 months. States bleeding per cycle can vary from being heavy for the entire menstrual bleeding or will just be a few days of spotting only. LMP 11/11 states very heavy first few days and still spotting now.  Notes new severe right sided cramping and abd pain w menses in August, now with every time she menstruates.  States concerns for hgb levels and notes she does feel dizzy and lightheaded when bleeding heavily. Not currently sexually active.  Pt declines any PAP or STI screening at this visit Would like breast and vaginal exam

## 2024-09-19 ENCOUNTER — Other Ambulatory Visit: Payer: Self-pay

## 2024-09-20 LAB — CERVICOVAGINAL ANCILLARY ONLY
Chlamydia: NEGATIVE
Comment: NEGATIVE
Comment: NEGATIVE
Comment: NORMAL
Neisseria Gonorrhea: NEGATIVE
Trichomonas: NEGATIVE

## 2024-09-22 ENCOUNTER — Ambulatory Visit: Payer: Self-pay | Admitting: Physician Assistant

## 2024-09-24 ENCOUNTER — Ambulatory Visit (HOSPITAL_BASED_OUTPATIENT_CLINIC_OR_DEPARTMENT_OTHER)
Admission: RE | Admit: 2024-09-24 | Discharge: 2024-09-24 | Disposition: A | Discharge status: Home / Self Care | Source: Ambulatory Visit | Attending: Physician Assistant | Admitting: Physician Assistant

## 2024-09-24 DIAGNOSIS — R109 Unspecified abdominal pain: Secondary | ICD-10-CM | POA: Diagnosis present

## 2024-09-30 ENCOUNTER — Other Ambulatory Visit: Payer: Self-pay

## 2024-11-04 ENCOUNTER — Encounter: Payer: Self-pay | Admitting: Internal Medicine

## 2024-11-08 ENCOUNTER — Encounter

## 2024-11-08 ENCOUNTER — Telehealth: Payer: Self-pay

## 2024-11-08 NOTE — Telephone Encounter (Signed)
 Patient no showed her PV video appt; no show letter to be sent to patient via MyChart as well as printed and mailed to the patient;

## 2024-11-29 ENCOUNTER — Encounter: Admitting: Internal Medicine

## 2024-12-17 ENCOUNTER — Ambulatory Visit: Admitting: Nurse Practitioner

## 2024-12-18 ENCOUNTER — Ambulatory Visit: Payer: Self-pay | Admitting: Obstetrics and Gynecology
